# Patient Record
Sex: Female | Born: 1945 | Race: White | Hispanic: No | Marital: Married | Smoking: Never smoker
Health system: Southern US, Community
[De-identification: ages and names within clinical notes are randomized; demographics above are authoritative.]

## PROBLEM LIST (undated history)

## (undated) DIAGNOSIS — M810 Age-related osteoporosis without current pathological fracture: Secondary | ICD-10-CM

## (undated) DIAGNOSIS — Z780 Asymptomatic menopausal state: Secondary | ICD-10-CM

## (undated) DIAGNOSIS — T7840XA Allergy, unspecified, initial encounter: Secondary | ICD-10-CM

## (undated) DIAGNOSIS — Z9109 Other allergy status, other than to drugs and biological substances: Secondary | ICD-10-CM

## (undated) DIAGNOSIS — I10 Essential (primary) hypertension: Secondary | ICD-10-CM

## (undated) DIAGNOSIS — M1990 Unspecified osteoarthritis, unspecified site: Secondary | ICD-10-CM

## (undated) DIAGNOSIS — D649 Anemia, unspecified: Secondary | ICD-10-CM

## (undated) DIAGNOSIS — H269 Unspecified cataract: Secondary | ICD-10-CM

## (undated) HISTORY — DX: Asymptomatic menopausal state: Z78.0

## (undated) HISTORY — DX: Essential (primary) hypertension: I10

## (undated) HISTORY — DX: Age-related osteoporosis without current pathological fracture: M81.0

## (undated) HISTORY — PX: OTHER SURGICAL HISTORY: SHX169

## (undated) HISTORY — DX: Unspecified cataract: H26.9

## (undated) HISTORY — DX: Allergy, unspecified, initial encounter: T78.40XA

## (undated) HISTORY — PX: TUBAL LIGATION: SHX77

## (undated) HISTORY — DX: Other allergy status, other than to drugs and biological substances: Z91.09

## (undated) HISTORY — PX: CATARACT EXTRACTION, BILATERAL: SHX1313

## (undated) HISTORY — DX: Unspecified osteoarthritis, unspecified site: M19.90

## (undated) HISTORY — PX: TONSILLECTOMY: SUR1361

## (undated) HISTORY — PX: KNEE ARTHROSCOPY: SUR90

## (undated) HISTORY — DX: Anemia, unspecified: D64.9

## (undated) HISTORY — PX: COLONOSCOPY: SHX174

---

## 2004-01-13 ENCOUNTER — Ambulatory Visit (HOSPITAL_BASED_OUTPATIENT_CLINIC_OR_DEPARTMENT_OTHER): Admission: RE | Admit: 2004-01-13 | Discharge: 2004-01-13 | Payer: Self-pay | Admitting: Obstetrics and Gynecology

## 2004-01-13 ENCOUNTER — Ambulatory Visit (HOSPITAL_COMMUNITY): Admission: RE | Admit: 2004-01-13 | Discharge: 2004-01-13 | Payer: Self-pay | Admitting: Obstetrics and Gynecology

## 2011-06-08 ENCOUNTER — Other Ambulatory Visit (HOSPITAL_COMMUNITY)
Admission: RE | Admit: 2011-06-08 | Discharge: 2011-06-08 | Disposition: A | Discharge status: Home / Self Care | Payer: Medicare Other | Source: Ambulatory Visit | Attending: Internal Medicine | Admitting: Internal Medicine

## 2011-06-08 DIAGNOSIS — Z124 Encounter for screening for malignant neoplasm of cervix: Secondary | ICD-10-CM | POA: Insufficient documentation

## 2012-04-06 ENCOUNTER — Encounter: Payer: Self-pay | Admitting: Internal Medicine

## 2012-05-26 ENCOUNTER — Ambulatory Visit (AMBULATORY_SURGERY_CENTER): Payer: Medicare Other

## 2012-05-26 VITALS — Ht 64.0 in | Wt 112.8 lb

## 2012-05-26 DIAGNOSIS — Z1211 Encounter for screening for malignant neoplasm of colon: Secondary | ICD-10-CM

## 2012-05-26 MED ORDER — MOVIPREP 100 G PO SOLR: 1.0000 | Freq: Once | ORAL | Status: DC

## 2012-05-29 ENCOUNTER — Encounter: Payer: Self-pay | Admitting: Gastroenterology

## 2012-06-06 ENCOUNTER — Encounter: Payer: Self-pay | Admitting: Gastroenterology

## 2012-06-06 ENCOUNTER — Ambulatory Visit (AMBULATORY_SURGERY_CENTER): Payer: Medicare Other | Admitting: Gastroenterology

## 2012-06-06 VITALS — BP 143/70 | HR 73 | Temp 98.2°F | Resp 19 | Ht 64.0 in | Wt 112.8 lb

## 2012-06-06 DIAGNOSIS — Z800 Family history of malignant neoplasm of digestive organs: Secondary | ICD-10-CM

## 2012-06-06 DIAGNOSIS — K5730 Diverticulosis of large intestine without perforation or abscess without bleeding: Secondary | ICD-10-CM

## 2012-06-06 DIAGNOSIS — Z1211 Encounter for screening for malignant neoplasm of colon: Secondary | ICD-10-CM

## 2012-06-06 MED ORDER — SODIUM CHLORIDE 0.9 % IV SOLN: 500.0000 mL | INTRAVENOUS | Status: DC

## 2012-06-06 NOTE — Op Note (Signed)
[facility] [address] [place], [address]   COLONOSCOPY PROCEDURE REPORT  PATIENT: Melissa Mayo, Melissa Mayo  MR#: [id number] BIRTHDATE: 1946-06-12 , 66  yrs. old GENDER: Female ENDOSCOPIST: Rachael Fee, MD REFERRED NF:[id number] Terri Piedra, M.D. PROCEDURE DATE:  06/06/2012 PROCEDURE:   Colonoscopy, diagnostic ASA CLASS:   Class III INDICATIONS:elevated risk screening. MEDICATIONS: Fentanyl 100 mcg IV, Versed 8 mg IV, and These medications were titrated to patient response per physician's verbal order  DESCRIPTION OF PROCEDURE:   After the risks benefits and alternatives of the procedure were thoroughly explained, informed consent was obtained.  A digital rectal exam revealed no abnormalities of the rectum.   The LB [id number]-[id number]  endoscope was introduced through the anus and advanced to the cecum, which was identified by both the appendix and ileocecal valve. No adverse events experienced.   The quality of the prep was good.  The instrument was then slowly withdrawn as the colon was fully examined.    COLON FINDINGS: Moderate diverticulosis was noted in the left colon. The colon mucosa was otherwise normal.  Retroflexed views revealed no abnormalities. The time to cecum=8 minutes 53 seconds. Withdrawal time=7 minutes 00 seconds.  The scope was withdrawn and the procedure completed. COMPLICATIONS: There were no complications.  ENDOSCOPIC IMPRESSION: Moderate diverticulosis was noted in the left colon Otherwise normal examination: no polyps or cancers  RECOMMENDATIONS: Given your significant family history of colon cancer, you should have a repeat colonoscopy in 5 years   eSigned:  Rachael Fee, MD 06/06/2012 9:56 AM

## 2012-06-06 NOTE — Progress Notes (Signed)
Patient did not experience any of the following events: a burn prior to discharge; a fall within the facility; wrong site/side/patient/procedure/implant event; or a hospital transfer or hospital admission upon discharge from the facility. (G8907) Patient did not have preoperative order for IV antibiotic SSI prophylaxis. (G8918)  

## 2012-06-06 NOTE — Patient Instructions (Signed)

## 2012-06-07 ENCOUNTER — Encounter: Payer: Medicare Other | Admitting: Internal Medicine

## 2012-06-07 ENCOUNTER — Telehealth: Payer: Self-pay | Admitting: Gastroenterology

## 2012-06-07 ENCOUNTER — Telehealth: Payer: Self-pay | Admitting: *Deleted

## 2012-06-07 NOTE — Telephone Encounter (Signed)
Pt states she still is a little nauseated and fatigued today.  Has been able to keep food down.  No fever or pain.  Told to eat as she feels like she can and to rest today.  Told to call office tomorrow am if she isn't feeling better.  Understanding voiced

## 2012-06-07 NOTE — Telephone Encounter (Signed)
No answer at # given.  Message left on voicemail. 

## 2016-11-05 DIAGNOSIS — I10 Essential (primary) hypertension: Secondary | ICD-10-CM | POA: Diagnosis not present

## 2017-02-04 DIAGNOSIS — L821 Other seborrheic keratosis: Secondary | ICD-10-CM | POA: Diagnosis not present

## 2017-02-04 DIAGNOSIS — D2239 Melanocytic nevi of other parts of face: Secondary | ICD-10-CM | POA: Diagnosis not present

## 2017-02-04 DIAGNOSIS — D2261 Melanocytic nevi of right upper limb, including shoulder: Secondary | ICD-10-CM | POA: Diagnosis not present

## 2017-02-04 DIAGNOSIS — L570 Actinic keratosis: Secondary | ICD-10-CM | POA: Diagnosis not present

## 2017-02-04 DIAGNOSIS — L814 Other melanin hyperpigmentation: Secondary | ICD-10-CM | POA: Diagnosis not present

## 2017-02-04 DIAGNOSIS — L718 Other rosacea: Secondary | ICD-10-CM | POA: Diagnosis not present

## 2017-03-21 DIAGNOSIS — H5213 Myopia, bilateral: Secondary | ICD-10-CM | POA: Diagnosis not present

## 2017-03-21 DIAGNOSIS — H52223 Regular astigmatism, bilateral: Secondary | ICD-10-CM | POA: Diagnosis not present

## 2017-03-21 DIAGNOSIS — H524 Presbyopia: Secondary | ICD-10-CM | POA: Diagnosis not present

## 2017-03-25 DIAGNOSIS — Z0100 Encounter for examination of eyes and vision without abnormal findings: Secondary | ICD-10-CM | POA: Diagnosis not present

## 2017-04-20 ENCOUNTER — Encounter: Payer: Self-pay | Admitting: Gastroenterology

## 2017-05-06 DIAGNOSIS — E559 Vitamin D deficiency, unspecified: Secondary | ICD-10-CM | POA: Diagnosis not present

## 2017-05-06 DIAGNOSIS — I10 Essential (primary) hypertension: Secondary | ICD-10-CM | POA: Diagnosis not present

## 2017-05-06 DIAGNOSIS — Z0000 Encounter for general adult medical examination without abnormal findings: Secondary | ICD-10-CM | POA: Diagnosis not present

## 2017-05-11 DIAGNOSIS — Z01419 Encounter for gynecological examination (general) (routine) without abnormal findings: Secondary | ICD-10-CM | POA: Diagnosis not present

## 2017-05-11 DIAGNOSIS — Z0000 Encounter for general adult medical examination without abnormal findings: Secondary | ICD-10-CM | POA: Diagnosis not present

## 2017-05-11 DIAGNOSIS — E871 Hypo-osmolality and hyponatremia: Secondary | ICD-10-CM | POA: Diagnosis not present

## 2017-05-11 DIAGNOSIS — I10 Essential (primary) hypertension: Secondary | ICD-10-CM | POA: Diagnosis not present

## 2017-05-27 ENCOUNTER — Encounter: Payer: Self-pay | Admitting: *Deleted

## 2017-05-27 DIAGNOSIS — R69 Illness, unspecified: Secondary | ICD-10-CM | POA: Diagnosis not present

## 2017-06-10 DIAGNOSIS — Z1231 Encounter for screening mammogram for malignant neoplasm of breast: Secondary | ICD-10-CM | POA: Diagnosis not present

## 2017-06-13 ENCOUNTER — Ambulatory Visit (AMBULATORY_SURGERY_CENTER): Payer: Self-pay | Admitting: *Deleted

## 2017-06-13 VITALS — Ht 63.0 in | Wt 115.4 lb

## 2017-06-13 DIAGNOSIS — Z800 Family history of malignant neoplasm of digestive organs: Secondary | ICD-10-CM

## 2017-06-13 MED ORDER — NA SULFATE-K SULFATE-MG SULF 17.5-3.13-1.6 GM/177ML PO SOLN: 1.0000 [IU] | Freq: Once | ORAL | 0 refills | Status: AC

## 2017-06-13 NOTE — Progress Notes (Signed)
No egg or soy allergy known to patient  No issues with past sedation with any surgeries  or procedures, no intubation problems  No diet pills per patient No home 02 use per patient  No blood thinners per patient  Pt denies issues with constipation  No A fib or A flutter  EMMI video sent to pt's e mail  Pt. declined 

## 2017-06-14 ENCOUNTER — Encounter: Payer: Self-pay | Admitting: Gastroenterology

## 2017-06-27 ENCOUNTER — Ambulatory Visit (AMBULATORY_SURGERY_CENTER): Payer: Medicare HMO | Admitting: Gastroenterology

## 2017-06-27 ENCOUNTER — Encounter: Payer: Self-pay | Admitting: Gastroenterology

## 2017-06-27 VITALS — BP 119/69 | HR 67 | Temp 97.5°F | Resp 21 | Ht 63.0 in | Wt 115.0 lb

## 2017-06-27 DIAGNOSIS — Z800 Family history of malignant neoplasm of digestive organs: Secondary | ICD-10-CM | POA: Diagnosis present

## 2017-06-27 DIAGNOSIS — Z1211 Encounter for screening for malignant neoplasm of colon: Secondary | ICD-10-CM

## 2017-06-27 DIAGNOSIS — K5730 Diverticulosis of large intestine without perforation or abscess without bleeding: Secondary | ICD-10-CM

## 2017-06-27 DIAGNOSIS — I10 Essential (primary) hypertension: Secondary | ICD-10-CM | POA: Diagnosis not present

## 2017-06-27 MED ORDER — SODIUM CHLORIDE 0.9 % IV SOLN: 500.0000 mL | INTRAVENOUS | Status: DC

## 2017-06-27 NOTE — Patient Instructions (Signed)
**   Handouts given on diverticulosis **  YOU HAD AN ENDOSCOPIC PROCEDURE TODAY AT [facility]:   Refer to the procedure report that was given to you for any specific questions about what was found during the examination.  If the procedure report does not answer your questions, please call your gastroenterologist to clarify.  If you requested that your care partner not be given the details of your procedure findings, then the procedure report has been included in a sealed envelope for you to review at your convenience later.  YOU SHOULD EXPECT: Some feelings of bloating in the abdomen. Passage of more gas than usual.  Walking can help get rid of the air that was put into your GI tract during the procedure and reduce the bloating. If you had a lower endoscopy (such as a colonoscopy or flexible sigmoidoscopy) you may notice spotting of blood in your stool or on the toilet paper. If you underwent a bowel prep for your procedure, you may not have a normal bowel movement for a few days.  Please Note:  You might notice some irritation and congestion in your nose or some drainage.  This is from the oxygen used during your procedure.  There is no need for concern and it should clear up in a day or so.  SYMPTOMS TO REPORT IMMEDIATELY:   Following lower endoscopy (colonoscopy or flexible sigmoidoscopy):  Excessive amounts of blood in the stool  Significant tenderness or worsening of abdominal pains  Swelling of the abdomen that is new, acute  Fever of 100F or higher   For urgent or emergent issues, a gastroenterologist can be reached at any hour by calling [phone].   DIET:  We do recommend a small meal at first, but then you may proceed to your regular diet.  Drink plenty of fluids but you should avoid alcoholic beverages for 24 hours.  ACTIVITY:  You should plan to take it easy for the rest of today and you should NOT DRIVE or use heavy machinery until tomorrow (because of the  sedation medicines used during the test).    FOLLOW UP: Our staff will call the number listed on your records the next business day following your procedure to check on you and address any questions or concerns that you may have regarding the information given to you following your procedure. If we do not reach you, we will leave a message.  However, if you are feeling well and you are not experiencing any problems, there is no need to return our call.  We will assume that you have returned to your regular daily activities without incident.  If any biopsies were taken you will be contacted by phone or by letter within the next 1-3 weeks.  Please call us at [phone] if you have not heard about the biopsies in 3 weeks.    SIGNATURES/CONFIDENTIALITY: You and/or your care partner have signed paperwork which will be entered into your electronic medical record.  These signatures attest to the fact that that the information above on your After Visit Summary has been reviewed and is understood.  Full responsibility of the confidentiality of this discharge information lies with you and/or your care-partner.

## 2017-06-27 NOTE — Progress Notes (Signed)
Report to PACU, RN, vss, BBS= Clear.  

## 2017-06-27 NOTE — Op Note (Signed)
[facility] Patient Name: Melissa Mayo Procedure Date: 06/27/2017 8:39 AM MRN: [id number] Endoscopist: Milus Banister , MD Age: 71 Referring MD:  Date of Birth: 05/12/1946 Gender: Female Account #: [id number] Procedure:                Colonoscopy Indications:              Screening patient at increased risk: Family history                            of 1st-degree relative with colorectal cancer at                            age 42 years (or older); father was diagnosed with                            CRC at age 28 Medicines:                Monitored Anesthesia Care Procedure:                Pre-Anesthesia Assessment:                           - Prior to the procedure, a History and Physical                            was performed, and patient medications and                            allergies were reviewed. The patient's tolerance of                            previous anesthesia was also reviewed. The risks                            and benefits of the procedure and the sedation                            options and risks were discussed with the patient.                            All questions were answered, and informed consent                            was obtained. Prior Anticoagulants: The patient has                            taken no previous anticoagulant or antiplatelet                            agents. ASA Grade Assessment: II - A patient with                            mild systemic disease. After reviewing the risks  and benefits, the patient was deemed in                            satisfactory condition to undergo the procedure.                           After obtaining informed consent, the colonoscope                            was passed under direct vision. Throughout the                            procedure, the patient's blood pressure, pulse, and                            oxygen saturations were monitored  continuously. The                            Model CF-[id number]) scope was introduced                            through the anus and advanced to the the cecum,                            identified by appendiceal orifice and ileocecal                            valve. The colonoscopy was performed without                            difficulty. The patient tolerated the procedure                            well. The quality of the bowel preparation was                            good. The ileocecal valve, appendiceal orifice, and                            rectum were photographed. Scope In: 8:46:16 AM Scope Out: 9:00:39 AM Scope Withdrawal Time: 0 hours 6 minutes 29 seconds  Total Procedure Duration: 0 hours 14 minutes 23 seconds  Findings:                 Multiple small and large-mouthed diverticula were                            found in the left colon.                           The exam was otherwise without abnormality on                            direct and retroflexion views. Complications:  No immediate complications. Estimated blood loss:                            None. Estimated Blood Loss:     Estimated blood loss: none. Impression:               - Diverticulosis in the left colon.                           - The examination was otherwise normal on direct                            and retroflexion views.                           - No polyps or cancers Recommendation:           - Patient has a contact number available for                            emergencies. The signs and symptoms of potential                            delayed complications were discussed with the                            patient. Return to normal activities tomorrow.                            Written discharge instructions were provided to the                            patient.                           - Resume previous diet.                           - Continue present  medications.                           You do not need any further colon cancer screening                            tests (including stool testing). These types of                            tests generally stop around age 40-80. Milus Banister, MD 06/27/2017 9:03:18 AM This report has been signed electronically.

## 2017-06-27 NOTE — Progress Notes (Signed)
Pt's states no medical or surgical changes since previsit or office visit. 

## 2017-06-28 ENCOUNTER — Telehealth: Payer: Self-pay | Admitting: *Deleted

## 2017-06-28 NOTE — Telephone Encounter (Signed)
  Follow up Call-  Call back number 06/27/2017  Post procedure Call Back phone  # [phone]  Permission to leave phone message Yes  Some recent data might be hidden     Patient questions:  Do you have a fever, pain , or abdominal swelling? No. Pain Score  0 *  Have you tolerated food without any problems? Yes.    Have you been able to return to your normal activities? No.  Do you have any questions about your discharge instructions: Diet   No. Medications  No. Follow up visit  No.  Do you have questions or concerns about your Care? Yes.  Patient reports still feeling a little "nauseated after the procedure/" she was able to eat and is keeping that down. Slept well last night. Just encouraged her to take it easy today and continue to push fluids. She will call us back if she continues to have nausea. SM  Actions: * If pain score is 4 or above: No action needed, pain <4.

## 2017-06-29 ENCOUNTER — Ambulatory Visit (INDEPENDENT_AMBULATORY_CARE_PROVIDER_SITE_OTHER)
Admission: RE | Admit: 2017-06-29 | Discharge: 2017-06-29 | Disposition: A | Discharge status: Home / Self Care | Payer: Medicare HMO | Source: Ambulatory Visit | Attending: Gastroenterology | Admitting: Gastroenterology

## 2017-06-29 ENCOUNTER — Other Ambulatory Visit (INDEPENDENT_AMBULATORY_CARE_PROVIDER_SITE_OTHER): Payer: Medicare HMO

## 2017-06-29 ENCOUNTER — Observation Stay (HOSPITAL_BASED_OUTPATIENT_CLINIC_OR_DEPARTMENT_OTHER)
Admission: EM | Admit: 2017-06-29 | Discharge: 2017-07-01 | Disposition: A | Discharge status: Home / Self Care | Payer: Medicare HMO | Attending: Internal Medicine | Admitting: Internal Medicine

## 2017-06-29 ENCOUNTER — Telehealth: Payer: Self-pay | Admitting: Gastroenterology

## 2017-06-29 ENCOUNTER — Encounter (HOSPITAL_BASED_OUTPATIENT_CLINIC_OR_DEPARTMENT_OTHER): Payer: Self-pay

## 2017-06-29 ENCOUNTER — Telehealth: Payer: Self-pay

## 2017-06-29 ENCOUNTER — Emergency Department (HOSPITAL_BASED_OUTPATIENT_CLINIC_OR_DEPARTMENT_OTHER): Payer: Medicare HMO

## 2017-06-29 DIAGNOSIS — Z79899 Other long term (current) drug therapy: Secondary | ICD-10-CM | POA: Diagnosis not present

## 2017-06-29 DIAGNOSIS — Z800 Family history of malignant neoplasm of digestive organs: Secondary | ICD-10-CM | POA: Diagnosis not present

## 2017-06-29 DIAGNOSIS — E861 Hypovolemia: Secondary | ICD-10-CM | POA: Diagnosis not present

## 2017-06-29 DIAGNOSIS — R112 Nausea with vomiting, unspecified: Secondary | ICD-10-CM

## 2017-06-29 DIAGNOSIS — M1990 Unspecified osteoarthritis, unspecified site: Secondary | ICD-10-CM | POA: Diagnosis not present

## 2017-06-29 DIAGNOSIS — I739 Peripheral vascular disease, unspecified: Secondary | ICD-10-CM | POA: Diagnosis not present

## 2017-06-29 DIAGNOSIS — R4182 Altered mental status, unspecified: Secondary | ICD-10-CM | POA: Diagnosis not present

## 2017-06-29 DIAGNOSIS — I10 Essential (primary) hypertension: Secondary | ICD-10-CM | POA: Diagnosis not present

## 2017-06-29 DIAGNOSIS — R51 Headache: Secondary | ICD-10-CM | POA: Diagnosis not present

## 2017-06-29 DIAGNOSIS — Z9841 Cataract extraction status, right eye: Secondary | ICD-10-CM | POA: Insufficient documentation

## 2017-06-29 DIAGNOSIS — M810 Age-related osteoporosis without current pathological fracture: Secondary | ICD-10-CM | POA: Diagnosis not present

## 2017-06-29 DIAGNOSIS — D649 Anemia, unspecified: Secondary | ICD-10-CM | POA: Diagnosis not present

## 2017-06-29 DIAGNOSIS — R9431 Abnormal electrocardiogram [ECG] [EKG]: Secondary | ICD-10-CM | POA: Diagnosis present

## 2017-06-29 DIAGNOSIS — M419 Scoliosis, unspecified: Secondary | ICD-10-CM | POA: Diagnosis not present

## 2017-06-29 DIAGNOSIS — Z9842 Cataract extraction status, left eye: Secondary | ICD-10-CM | POA: Insufficient documentation

## 2017-06-29 DIAGNOSIS — Z9889 Other specified postprocedural states: Secondary | ICD-10-CM

## 2017-06-29 DIAGNOSIS — I4581 Long QT syndrome: Secondary | ICD-10-CM | POA: Diagnosis not present

## 2017-06-29 DIAGNOSIS — E876 Hypokalemia: Secondary | ICD-10-CM | POA: Diagnosis not present

## 2017-06-29 DIAGNOSIS — E871 Hypo-osmolality and hyponatremia: Secondary | ICD-10-CM | POA: Diagnosis not present

## 2017-06-29 DIAGNOSIS — E860 Dehydration: Secondary | ICD-10-CM | POA: Diagnosis not present

## 2017-06-29 DIAGNOSIS — R110 Nausea: Secondary | ICD-10-CM | POA: Diagnosis not present

## 2017-06-29 LAB — CBC WITH DIFFERENTIAL/PLATELET
BASOS ABS: 0 10*3/uL (ref 0.0–0.1)
Basophils Relative: 0.1 % (ref 0.0–3.0)
EOS ABS: 0 10*3/uL (ref 0.0–0.7)
Eosinophils Relative: 0.1 % (ref 0.0–5.0)
HCT: 37.8 % (ref 36.0–46.0)
Hemoglobin: 13.2 g/dL (ref 12.0–15.0)
LYMPHS ABS: 0.6 10*3/uL — AB (ref 0.7–4.0)
Lymphocytes Relative: 7.1 % — ABNORMAL LOW (ref 12.0–46.0)
MCHC: 35 g/dL (ref 30.0–36.0)
MCV: 87 fl (ref 78.0–100.0)
MONO ABS: 0.2 10*3/uL (ref 0.1–1.0)
Monocytes Relative: 3 % (ref 3.0–12.0)
NEUTROS ABS: 7.3 10*3/uL (ref 1.4–7.7)
NEUTROS PCT: 89.7 % — AB (ref 43.0–77.0)
Platelets: 309 10*3/uL (ref 150.0–400.0)
RBC: 4.34 Mil/uL (ref 3.87–5.11)
RDW: 13.5 % (ref 11.5–15.5)
WBC: 8.1 10*3/uL (ref 4.0–10.5)

## 2017-06-29 LAB — COMPREHENSIVE METABOLIC PANEL
ALBUMIN: 4.1 g/dL (ref 3.5–5.0)
ALT: 11 U/L (ref 0–35)
ALT: 15 U/L (ref 14–54)
AST: 17 U/L (ref 0–37)
AST: 29 U/L (ref 15–41)
Albumin: 4.3 g/dL (ref 3.5–5.2)
Alkaline Phosphatase: 74 U/L (ref 39–117)
Alkaline Phosphatase: 74 U/L (ref 38–126)
Anion gap: 12 (ref 5–15)
BILIRUBIN TOTAL: 1.1 mg/dL (ref 0.2–1.2)
BILIRUBIN TOTAL: 1.3 mg/dL — AB (ref 0.3–1.2)
BUN: 7 mg/dL (ref 6–23)
BUN: 8 mg/dL (ref 6–20)
CHLORIDE: 79 mmol/L — AB (ref 101–111)
CO2: 27 meq/L (ref 19–32)
CO2: 24 mmol/L (ref 22–32)
CREATININE: 0.49 mg/dL (ref 0.40–1.20)
Calcium: 9.4 mg/dL (ref 8.4–10.5)
Calcium: 9 mg/dL (ref 8.9–10.3)
Chloride: 80 mEq/L — ABNORMAL LOW (ref 96–112)
Creatinine, Ser: 0.41 mg/dL — ABNORMAL LOW (ref 0.44–1.00)
GFR calc Af Amer: >60 mL/min (ref >60)
GFR calc non Af Amer: >60 mL/min (ref >60)
GFR: 131.75 mL/min (ref >60.00)
GLUCOSE: 125 mg/dL — AB (ref 70–99)
GLUCOSE: 126 mg/dL — AB (ref 65–99)
POTASSIUM: 2.6 mmol/L — AB (ref 3.5–5.1)
Potassium: 2.9 mEq/L — ABNORMAL LOW (ref 3.5–5.1)
SODIUM: 117 meq/L — AB (ref 135–145)
Sodium: 115 mmol/L — CL (ref 135–145)
TOTAL PROTEIN: 7.3 g/dL (ref 6.0–8.3)
Total Protein: 7.4 g/dL (ref 6.5–8.1)

## 2017-06-29 LAB — CBC
HEMATOCRIT: 34.1 % — AB (ref 36.0–46.0)
Hemoglobin: 12.4 g/dL (ref 12.0–15.0)
MCH: 29.9 pg (ref 26.0–34.0)
MCHC: 36.4 g/dL — AB (ref 30.0–36.0)
MCV: 82.2 fL (ref 78.0–100.0)
Platelets: 295 10*3/uL (ref 150–400)
RBC: 4.15 MIL/uL (ref 3.87–5.11)
RDW: 12.2 % (ref 11.5–15.5)
WBC: 7 10*3/uL (ref 4.0–10.5)

## 2017-06-29 LAB — URINALYSIS, ROUTINE W REFLEX MICROSCOPIC
BILIRUBIN URINE: NEGATIVE
GLUCOSE, UA: NEGATIVE mg/dL
KETONES UR: 15 mg/dL — AB
Leukocytes, UA: NEGATIVE
Nitrite: NEGATIVE
PH: 7.5 (ref 5.0–8.0)
Protein, ur: NEGATIVE mg/dL
Specific Gravity, Urine: 1.01 (ref 1.005–1.030)

## 2017-06-29 LAB — BASIC METABOLIC PANEL
Anion gap: 11 (ref 5–15)
BUN: 6 mg/dL (ref 6–20)
CHLORIDE: 82 mmol/L — AB (ref 101–111)
CO2: 25 mmol/L (ref 22–32)
Calcium: 8.5 mg/dL — ABNORMAL LOW (ref 8.9–10.3)
Creatinine, Ser: 0.53 mg/dL (ref 0.44–1.00)
Glucose, Bld: 108 mg/dL — ABNORMAL HIGH (ref 65–99)
POTASSIUM: 2.8 mmol/L — AB (ref 3.5–5.1)
SODIUM: 118 mmol/L — AB (ref 135–145)

## 2017-06-29 LAB — URINALYSIS, MICROSCOPIC (REFLEX)

## 2017-06-29 MED ORDER — POTASSIUM CHLORIDE 10 MEQ/100ML IV SOLN
10.0000 meq | INTRAVENOUS | Status: AC
  Administered 2017-06-29 – 2017-06-30 (×3): 10 meq via INTRAVENOUS
  Filled 2017-06-29 (×3): qty 100

## 2017-06-29 MED ORDER — POTASSIUM CHLORIDE CRYS ER 20 MEQ PO TBCR
20.0000 meq | EXTENDED_RELEASE_TABLET | Freq: Once | ORAL | Status: AC
  Administered 2017-06-29: 20 meq via ORAL
  Filled 2017-06-29: qty 1

## 2017-06-29 MED ORDER — POTASSIUM CHLORIDE CRYS ER 20 MEQ PO TBCR
EXTENDED_RELEASE_TABLET | ORAL | Status: AC
  Filled 2017-06-29: qty 1

## 2017-06-29 MED ORDER — SODIUM CHLORIDE 0.9 % IV SOLN: 250.0000 mL | INTRAVENOUS | Status: DC | PRN

## 2017-06-29 MED ORDER — SODIUM CHLORIDE 0.9% FLUSH
3.0000 mL | Freq: Two times a day (BID) | INTRAVENOUS | Status: DC
  Administered 2017-06-29 – 2017-07-01 (×4): 3 mL via INTRAVENOUS

## 2017-06-29 MED ORDER — CALCIUM CITRATE-VITAMIN D 315-200 MG-UNIT PO TABS: 2.0000 | ORAL_TABLET | Freq: Every day | ORAL | Status: DC

## 2017-06-29 MED ORDER — SODIUM CHLORIDE 0.9 % IV BOLUS (SEPSIS)
1000.0000 mL | Freq: Once | INTRAVENOUS | Status: AC
  Administered 2017-06-29: 1000 mL via INTRAVENOUS

## 2017-06-29 MED ORDER — LORAZEPAM 2 MG/ML IJ SOLN: 0.5000 mg | INTRAMUSCULAR | Status: DC | PRN

## 2017-06-29 MED ORDER — CALCIUM CARBONATE-VITAMIN D 500-200 MG-UNIT PO TABS
2.0000 | ORAL_TABLET | Freq: Every day | ORAL | Status: DC
  Administered 2017-06-30 – 2017-07-01 (×2): 2 via ORAL
  Filled 2017-06-29 (×2): qty 2

## 2017-06-29 MED ORDER — VITAMIN B-12 1000 MCG PO TABS
1000.0000 ug | ORAL_TABLET | Freq: Every day | ORAL | Status: DC
  Administered 2017-06-30 – 2017-07-01 (×2): 1000 ug via ORAL
  Filled 2017-06-29 (×2): qty 1

## 2017-06-29 MED ORDER — POTASSIUM CHLORIDE 10 MEQ/100ML IV SOLN
10.0000 meq | Freq: Once | INTRAVENOUS | Status: AC
  Administered 2017-06-29: 10 meq via INTRAVENOUS
  Filled 2017-06-29: qty 100

## 2017-06-29 MED ORDER — ACETAMINOPHEN 650 MG RE SUPP: 650.0000 mg | Freq: Four times a day (QID) | RECTAL | Status: DC | PRN

## 2017-06-29 MED ORDER — BISACODYL 5 MG PO TBEC: 5.0000 mg | DELAYED_RELEASE_TABLET | Freq: Every day | ORAL | Status: DC | PRN

## 2017-06-29 MED ORDER — POTASSIUM CHLORIDE IN NACL 40-0.9 MEQ/L-% IV SOLN
INTRAVENOUS | Status: AC
  Administered 2017-06-29: 60 mL/h via INTRAVENOUS
  Filled 2017-06-29: qty 1000

## 2017-06-29 MED ORDER — ACETAMINOPHEN 325 MG PO TABS: 650.0000 mg | ORAL_TABLET | Freq: Four times a day (QID) | ORAL | Status: DC | PRN

## 2017-06-29 MED ORDER — LORATADINE 10 MG PO TABS
10.0000 mg | ORAL_TABLET | Freq: Every day | ORAL | Status: DC
  Administered 2017-06-30 – 2017-07-01 (×2): 10 mg via ORAL
  Filled 2017-06-29 (×2): qty 1

## 2017-06-29 MED ORDER — ONDANSETRON HCL 4 MG/2ML IJ SOLN
4.0000 mg | Freq: Once | INTRAMUSCULAR | Status: AC | PRN
  Administered 2017-06-29: 4 mg via INTRAVENOUS
  Filled 2017-06-29: qty 2

## 2017-06-29 MED ORDER — SODIUM CHLORIDE 0.9% FLUSH
3.0000 mL | Freq: Two times a day (BID) | INTRAVENOUS | Status: DC
  Administered 2017-06-30: 3 mL via INTRAVENOUS

## 2017-06-29 MED ORDER — POTASSIUM CHLORIDE 10 MEQ/100ML IV SOLN: 10.0000 meq | INTRAVENOUS | Status: DC

## 2017-06-29 MED ORDER — ENOXAPARIN SODIUM 30 MG/0.3ML ~~LOC~~ SOLN
30.0000 mg | SUBCUTANEOUS | Status: DC
  Administered 2017-06-29: 30 mg via SUBCUTANEOUS
  Filled 2017-06-29: qty 0.3

## 2017-06-29 MED ORDER — HYDRALAZINE HCL 20 MG/ML IJ SOLN: 10.0000 mg | INTRAMUSCULAR | Status: DC | PRN

## 2017-06-29 MED ORDER — POTASSIUM CHLORIDE CRYS ER 20 MEQ PO TBCR
40.0000 meq | EXTENDED_RELEASE_TABLET | Freq: Once | ORAL | Status: AC
  Administered 2017-06-29: 40 meq via ORAL
  Filled 2017-06-29: qty 2

## 2017-06-29 MED ORDER — MAGNESIUM SULFATE IN D5W 1-5 GM/100ML-% IV SOLN
1.0000 g | Freq: Once | INTRAVENOUS | Status: AC
  Administered 2017-06-29: 1 g via INTRAVENOUS
  Filled 2017-06-29: qty 100

## 2017-06-29 MED ORDER — ATENOLOL 50 MG PO TABS
100.0000 mg | ORAL_TABLET | Freq: Every day | ORAL | Status: DC
  Administered 2017-06-30 – 2017-07-01 (×2): 100 mg via ORAL
  Filled 2017-06-29 (×2): qty 2

## 2017-06-29 MED ORDER — SENNOSIDES-DOCUSATE SODIUM 8.6-50 MG PO TABS: 1.0000 | ORAL_TABLET | Freq: Every evening | ORAL | Status: DC | PRN

## 2017-06-29 MED ORDER — HYDROCODONE-ACETAMINOPHEN 5-325 MG PO TABS: 1.0000 | ORAL_TABLET | ORAL | Status: DC | PRN

## 2017-06-29 MED ORDER — SODIUM CHLORIDE 0.9% FLUSH: 3.0000 mL | INTRAVENOUS | Status: DC | PRN

## 2017-06-29 NOTE — Telephone Encounter (Signed)
Lets get some labs (cbc, bmet) and KUB (flat and upright, r/o free air).  Push fluids for now.

## 2017-06-29 NOTE — Telephone Encounter (Signed)
Pt is having nausea since 10/9 colon on 06/27/17.  Normal BM's.  Weak and dizzy.  Vomited last night and felt better for a little while.  She has not taken anything for the nausea.  Not eating and drinking.  Please advise.

## 2017-06-29 NOTE — H&P (Signed)
History and Physical    Melissa Mayo ATF:[id number] DOB: 11/04/1945 DOA: 06/29/2017  PCP: Deland Pretty, MD   Patient coming from: Home, by way of [facility]   Chief Complaint: nausea, vomiting, malaise   HPI: Melissa Mayo is a 71 y.o. female with medical history significant for hypertension, now presenting to the emergency department for evaluation of nausea, vomiting, and malaise. Patient had been in her usual state of fairly good health and underwent a routine screening colonoscopy on 06/27/2017, tolerated the procedure well. The following day, she developed mild abdominal discomfort with nausea and nonbloody vomiting. Symptoms continued to persist and she contacted the GI clinic today for advice. She was brought in for a KUB and basic blood work, was noted to have a critical hyponatremia to 117, and was directed to the ED for further evaluation of this. Reports a mild headache, but no change in vision or hearing, no focal numbness or weakness, and no confusion.   [facility] ED Course: Upon arrival to the ED, patient is found to be afebrile, saturating well on room air, and with vitals otherwise stable. EKG features a sinus rhythm with QTc interval prolonged 559 ms. KUB is a normal exam and noncontrast head CT is negative for acute intercranial abnormality. Chemistry panels notable for sodium of 115, potassium 2.6, and chloride of 79. CBC is unremarkable and urinalysis is also unremarkable. Patient was treated with Zofran, 1 L of normal saline, 10 mEq IV potassium, and 40 mg of Lipitor potassium. She remained hemodynamically stable in the ED and in no apparent distress, and she will be admitted to the telemetry unit for ongoing evaluation and management of hyponatremia.   Review of Systems:  All other systems reviewed and apart from HPI, are negative.  Past Medical History:  Diagnosis Date  . Allergy   . Anemia   . Arthritis   . Cataract    both eyes surgically removed  .  Environmental allergies   . Hypertension   . Osteoporosis   . Postmenopausal     Past Surgical History:  Procedure Laterality Date  . CATARACT EXTRACTION, BILATERAL     2003  . COLONOSCOPY    . cyst underneath left breast     1999 benign  . gynecology procedure     2004  . KNEE ARTHROSCOPY     1961  . ovarian cystectomy, left     1970  . TONSILLECTOMY     age 61  . TUBAL LIGATION     1980     reports that she has never smoked. She has never used smokeless tobacco. She reports that she does not drink alcohol or use drugs.  No Known Allergies  Family History  Problem Relation Age of Onset  . Colon cancer Father   . Colon polyps Neg Hx   . Esophageal cancer Neg Hx   . Rectal cancer Neg Hx   . Stomach cancer Neg Hx      Prior to Admission medications   Medication Sig Start Date End Date Taking? Authorizing Provider  atenolol-chlorthalidone (TENORETIC) 100-25 MG per tablet Take 0.5 tablets by mouth daily.    [provider]  calcium citrate-vitamin D (CITRACAL+D) 315-200 MG-UNIT per tablet Take 2 tablets by mouth daily.    [provider]  cetirizine (ZYRTEC) 10 MG tablet Take 10 mg by mouth daily.    [provider]  cyanocobalamin 1000 MCG tablet Take 1,000 mcg by mouth daily.    [provider]  metroNIDAZOLE (METROCREAM) 0.75 % cream Apply 1 application topically 2 (two) times daily.    [provider]  potassium chloride (K-DUR) 10 MEQ tablet Take 10 mEq by mouth daily.    [provider]  Probiotic Product (PROBIOTIC DAILY PO) Take 1 capsule by mouth daily.    [provider]    Physical Exam: Vitals:   06/29/17 1715 06/29/17 1800 06/29/17 1845 06/29/17 2038  BP: (!) 162/73 (!) 169/74 (!) 165/77 (!) 161/74  Pulse:  75 70 70  Resp: 20 18 18 18   Temp:    98.1 F (36.7 C)  TempSrc:    Oral  SpO2: 99% 100% 100% 97%  Weight:      Height:          Constitutional: NAD, calm, comfortable,  frail-appearing Eyes: PERTLA, lids and conjunctivae normal ENMT: Mucous membranes are moist. Posterior pharynx clear of any exudate or lesions.   Neck: normal, supple, no masses, no thyromegaly Respiratory: clear to auscultation bilaterally, no wheezing, no crackles. Normal respiratory effort.   Cardiovascular: S1 & S2 heard, regular rate and rhythm. No extremity edema. No significant JVD. Abdomen: No distension, no tenderness, no masses palpated. Bowel sounds normal.  Musculoskeletal: no clubbing / cyanosis. No joint deformity upper and lower extremities.   Skin: no significant rashes, lesions, ulcers. Poor turgor. Neurologic: CN 2-12 grossly intact. Sensation intact. Strength 5/5 in all 4 limbs.  Psychiatric: Alert and oriented x 3. Pleasant, cooperative.     Labs on Admission: I have personally reviewed following labs and imaging studies  CBC:  Recent Labs Lab 06/29/17 1019 06/29/17 1449  WBC 8.1 7.0  NEUTROABS 7.3  --   HGB 13.2 12.4  HCT 37.8 34.1*  MCV 87.0 82.2  PLT 309.0 287   Basic Metabolic Panel:  Recent Labs Lab 06/29/17 1019 06/29/17 1449  NA 117* 115*  K 2.9* 2.6*  CL 80* 79*  CO2 27 24  GLUCOSE 125* 126*  BUN 7 8  CREATININE 0.49 0.41*  CALCIUM 9.4 9.0   GFR: Estimated Creatinine Clearance: 52.4 mL/min (A) (by C-G formula based on SCr of 0.41 mg/dL (L)). Liver Function Tests:  Recent Labs Lab 06/29/17 1019 06/29/17 1449  AST 17 29  ALT 11 15  ALKPHOS 74 74  BILITOT 1.1 1.3*  PROT 7.3 7.4  ALBUMIN 4.3 4.1   No results for input(s): LIPASE, AMYLASE in the last 168 hours. No results for input(s): AMMONIA in the last 168 hours. Coagulation Profile: No results for input(s): INR, PROTIME in the last 168 hours. Cardiac Enzymes: No results for input(s): CKTOTAL, CKMB, CKMBINDEX, TROPONINI in the last 168 hours. BNP (last 3 results) No results for input(s): PROBNP in the last 8760 hours. HbA1C: No results for input(s): HGBA1C in the last 72  hours. CBG: No results for input(s): GLUCAP in the last 168 hours. Lipid Profile: No results for input(s): CHOL, HDL, LDLCALC, TRIG, CHOLHDL, LDLDIRECT in the last 72 hours. Thyroid Function Tests: No results for input(s): TSH, T4TOTAL, FREET4, T3FREE, THYROIDAB in the last 72 hours. Anemia Panel: No results for input(s): VITAMINB12, FOLATE, FERRITIN, TIBC, IRON, RETICCTPCT in the last 72 hours. Urine analysis:    Component Value Date/Time   COLORURINE YELLOW 06/29/2017 1615   APPEARANCEUR CLOUDY (A) 06/29/2017 1615   LABSPEC 1.010 06/29/2017 1615   PHURINE 7.5 06/29/2017 1615   GLUCOSEU NEGATIVE 06/29/2017 1615   HGBUR SMALL (A) 06/29/2017 1615   BILIRUBINUR NEGATIVE 06/29/2017 1615   KETONESUR 15 (A)  06/29/2017 [place] 06/29/2017 1615   NITRITE NEGATIVE 06/29/2017 1615   LEUKOCYTESUR NEGATIVE 06/29/2017 1615   Sepsis Labs: @LABRCNTIP (procalcitonin:4,lacticidven:4) )No results found for this or any previous visit (from the past 240 hour(s)).   Radiological Exams on Admission: Dg Abd 1 View  Result Date: 06/29/2017 CLINICAL DATA:  Nausea and weakness.  Soreness after colonoscopy. EXAM: ABDOMEN - 1 VIEW COMPARISON:  None. FINDINGS: Dextroscoliosis of the lumbar spine. Nonobstructive bowel gas pattern. Oval-shaped density in the left abdomen may represent an ingested tablet. Limited evaluation for free air on this supine image. IMPRESSION: Normal bowel gas pattern. Scoliosis. Electronically Signed   By: Markus Daft M.D.   On: 06/29/2017 16:04   Ct Head Wo Contrast  Result Date: 06/29/2017 CLINICAL DATA:  Altered mental status, nausea. EXAM: CT HEAD WITHOUT CONTRAST TECHNIQUE: Contiguous axial images were obtained from the base of the skull through the vertex without intravenous contrast. COMPARISON:  None. FINDINGS: Brain: There is atrophy and chronic small vessel disease changes. No acute intracranial abnormality. Specifically, no hemorrhage, hydrocephalus, mass  lesion, acute infarction, or significant intracranial injury. Vascular: No hyperdense vessel or unexpected calcification. Skull: No acute calvarial abnormality. Sinuses/Orbits: No acute finding. Other: None IMPRESSION: No acute intracranial abnormality. Atrophy, chronic microvascular disease. Electronically Signed   By: Rolm Baptise M.D.   On: 06/29/2017 17:55    EKG: Independently reviewed. Sinus rhythm, QTc 559 ms.   Assessment/Plan  1. Hyponatremia - Pt presents with nausea, vomiting, mild headache, and malaise after screening colonoscopy the day prior  - Found to have serum sodium of 115 - Appeared hypovolemic in setting of colonoscopy prep and N/V, and she was treated with 1 liter NS in ED - Suspect this is secondary to hypovolemia, but chlorthalidone may be contributing and will be held  - Plan to check repeat chem panel now and q6h, check urine sodium and osms, consider giving more IVF pending repeat chemistries  - Goal will be for serum sodium of ~122 tomorrow afternoon  2. Hypokalemia  - Serum potassium is 2.6 on admission, likely secondary to GI-losses  - She was treated in the ED with 10 mEq IV and 40 mEq oral potassium  - Plan to continue cardiac monitoring, repeat chemistry panel now    3. Prolonged QT interval  - QTc is prolonged to 559 ms on admission - Likely secondary to hypokalemia, treating as above  - Continue cardiac monitoring, avoid offending agents    4. Hypertension  - BP is slightly elevated on arrival  - Hold chlorthalidone in setting of electrolyte abnormalities, continue atenolol  - Use hydralazine IVP's prn    5. Nausea, vomiting  - Secondary to colonoscopy prep vs hyponatremia  - Relieved with Zofran in ED - Will use low-dose prn Ativan for now until QTc normalizes     DVT prophylaxis: Lovenox Code Status: Full  Family Communication: Discussed with patient Disposition Plan: Observe on telemetry Consults called: None Admission status: Observation     Vianne Bulls, MD [facility] Pager [phone]  If 7PM-7AM, please contact night-coverage [email or web address] Password [email or web address]  06/29/2017, 9:14 PM

## 2017-06-29 NOTE — ED Notes (Signed)
Pt on monitor 

## 2017-06-29 NOTE — Telephone Encounter (Signed)
The lab called with a critical sodium level of 117.

## 2017-06-29 NOTE — ED Notes (Signed)
Report given to [facility] 

## 2017-06-29 NOTE — ED Notes (Signed)
Patient transported to CT 

## 2017-06-29 NOTE — ED Provider Notes (Signed)
[facility] DEPT [facility] Provider Note   CSN: [id number] Arrival date & time: 06/29/17  1348     History   Chief Complaint Chief Complaint  Patient presents with  . Abnormal Lab    HPI Melissa Mayo is a 71 y.o. female.  Pt presents to the ED today with n/v and low sodium.  Pt had a colonoscopy on 10/8 by Dr. Ardis Hughs.  Since then, she has not been feeling well and has had n/v.  She called Dr. Audelia Acton office and was told to return today if she continues to feel poorly.  She did make an appt and was seen this morning.  She had labs drawn which showed a sodium of 117.  She also had a 1 view abdomen xray which has not yet been read.  The pt does have a hx of low sodium in the past, but it was corrected according to patient.      Past Medical History:  Diagnosis Date  . Allergy   . Anemia   . Arthritis   . Cataract    both eyes surgically removed  . Environmental allergies   . Hypertension   . Osteoporosis   . Postmenopausal     There are no active problems to display for this patient.   Past Surgical History:  Procedure Laterality Date  . CATARACT EXTRACTION, BILATERAL     2003  . COLONOSCOPY    . cyst underneath left breast     1999 benign  . gynecology procedure     2004  . KNEE ARTHROSCOPY     1961  . ovarian cystectomy, left     1970  . TONSILLECTOMY     age 50  . TUBAL LIGATION     1980    OB History    No data available       Home Medications    Prior to Admission medications   Medication Sig Start Date End Date Taking? Authorizing Provider  atenolol-chlorthalidone (TENORETIC) 100-25 MG per tablet Take 0.5 tablets by mouth daily.    [provider]  calcium citrate-vitamin D (CITRACAL+D) 315-200 MG-UNIT per tablet Take 2 tablets by mouth daily.    [provider]  cetirizine (ZYRTEC) 10 MG tablet Take 10 mg by mouth daily.    [provider]  cyanocobalamin 1000 MCG tablet Take 1,000 mcg by mouth daily.    [provider]  metroNIDAZOLE (METROCREAM) 0.75 % cream Apply 1 application topically 2 (two) times daily.    [provider]  potassium chloride (K-DUR) 10 MEQ tablet Take 10 mEq by mouth daily.    [provider]  Probiotic Product (PROBIOTIC DAILY PO) Take 1 capsule by mouth daily.    [provider]    Family History Family History  Problem Relation Age of Onset  . Colon cancer Father   . Colon polyps Neg Hx   . Esophageal cancer Neg Hx   . Rectal cancer Neg Hx   . Stomach cancer Neg Hx     Social History Social History  Substance Use Topics  . Smoking status: Never Smoker  . Smokeless tobacco: Never Used  . Alcohol use No     Allergies   Patient has no known allergies.   Review of Systems Review of Systems  Gastrointestinal: Positive for nausea and vomiting.  Neurological: Positive for weakness.  All other systems reviewed and are negative.    Physical Exam Updated Vital Signs BP (!) 168/81   Pulse  76   Temp 97.6 F (36.4 C) (Oral)   Resp 18   Ht 5\' 3"  (1.6 m)   Wt 52.2 kg (115 lb)   SpO2 100%   BMI 20.37 kg/m   Physical Exam  Constitutional: She is oriented to person, place, and time. She appears well-developed and well-nourished.  HENT:  Head: Normocephalic and atraumatic.  Right Ear: External ear normal.  Left Ear: External ear normal.  Nose: Nose normal.  Mouth/Throat: Mucous membranes are dry.  Eyes: Pupils are equal, round, and reactive to light. Conjunctivae and EOM are normal.  Neck: Normal range of motion. Neck supple.  Cardiovascular: Normal rate, regular rhythm, normal heart sounds and intact distal pulses.   Pulmonary/Chest: Effort normal and breath sounds normal.  Abdominal: Soft. Bowel sounds are normal.  Musculoskeletal: Normal range of motion.  Neurological: She is alert and oriented to person, place, and time.  Skin: Skin is warm. Capillary refill takes less than 2 seconds.  Psychiatric: She has a  normal mood and affect. Her behavior is normal. Judgment and thought content normal.  Nursing note and vitals reviewed.    ED Treatments / Results  Labs (all labs ordered are listed, but only abnormal results are displayed) Labs Reviewed  COMPREHENSIVE METABOLIC PANEL - Abnormal; Notable for the following:       Result Value   Sodium 115 (*)    Potassium 2.6 (*)    Chloride 79 (*)    Glucose, Bld 126 (*)    Creatinine, Ser 0.41 (*)    Total Bilirubin 1.3 (*)    All other components within normal limits  CBC - Abnormal; Notable for the following:    HCT 34.1 (*)    MCHC 36.4 (*)    All other components within normal limits  URINALYSIS, ROUTINE W REFLEX MICROSCOPIC    EKG  EKG Interpretation  Date/Time:  Wednesday June 29 2017 15:47:39 EDT Ventricular Rate:  79 PR Interval:    QRS Duration: 85 QT Interval:  487 QTC Calculation: 559 R Axis:   34 Text Interpretation:  Sinus rhythm Borderline repolarization abnormality Prolonged QT interval Confirmed by Isla Pence [phone]) on 06/29/2017 3:57:30 PM       Radiology Dg Abd 1 View  Result Date: 06/29/2017 CLINICAL DATA:  Nausea and weakness.  Soreness after colonoscopy. EXAM: ABDOMEN - 1 VIEW COMPARISON:  None. FINDINGS: Dextroscoliosis of the lumbar spine. Nonobstructive bowel gas pattern. Oval-shaped density in the left abdomen may represent an ingested tablet. Limited evaluation for free air on this supine image. IMPRESSION: Normal bowel gas pattern. Scoliosis. Electronically Signed   By: Markus Daft M.D.   On: 06/29/2017 16:04    Procedures Procedures (including critical care time)  Medications Ordered in ED Medications  potassium chloride 10 mEq in 100 mL IVPB (not administered)  potassium chloride SA (K-DUR,KLOR-CON) CR tablet 40 mEq (not administered)  potassium chloride SA (K-DUR,KLOR-CON) 20 MEQ CR tablet (not administered)  ondansetron (ZOFRAN) injection 4 mg (4 mg Intravenous Given 06/29/17 1458)    sodium chloride 0.9 % bolus 1,000 mL (1,000 mLs Intravenous New Bag/Given 06/29/17 1512)     Initial Impression / Assessment and Plan / ED Course  I have reviewed the triage vital signs and the nursing notes.  Pertinent labs & imaging results that were available during my care of the patient were reviewed by me and considered in my medical decision making (see chart for details).    Pt's sx likely due to the colonoscopy prep.  She is not volume overloaded, but contracted.  Pt given IV NS and IV/oral potassium.  Pt d/w Dr. Karleen Hampshire ([facility] at [facility]) who will admit her.  Final Clinical Impressions(s) / ED Diagnoses   Final diagnoses:  Hyponatremia  Hypokalemia  Non-intractable vomiting with nausea, unspecified vomiting type  Dehydration    New Prescriptions New Prescriptions   No medications on file     Isla Pence, MD 06/29/17 1625

## 2017-06-29 NOTE — Telephone Encounter (Signed)
The pt has been advised to go to the ED for evaluation

## 2017-06-29 NOTE — Telephone Encounter (Signed)
She needs to go to the ER 

## 2017-06-29 NOTE — Telephone Encounter (Signed)
The pt has been advised and will come in now for labs and xray.

## 2017-06-29 NOTE — ED Triage Notes (Signed)
Pt was advised by GI to come to ED for sodium level 117- also c/o n/v started yesterday-NAD-presents to triage in w/c

## 2017-06-29 NOTE — Progress Notes (Signed)
71 year old with recent colonoscopy came into [facility]  with complaints of nausea and vomiting. She was found to be hypokalemic and hyponatremic, with mental status intact. Rest of VS wnl.  Request admission to [facility] for observation for evaluation and repletion of electrolytes. ADMIT to telemetry under observation.   Hosie Poisson, MD [phone]

## 2017-06-30 DIAGNOSIS — I10 Essential (primary) hypertension: Secondary | ICD-10-CM | POA: Diagnosis not present

## 2017-06-30 DIAGNOSIS — E871 Hypo-osmolality and hyponatremia: Secondary | ICD-10-CM | POA: Diagnosis not present

## 2017-06-30 DIAGNOSIS — E860 Dehydration: Secondary | ICD-10-CM | POA: Diagnosis not present

## 2017-06-30 DIAGNOSIS — R112 Nausea with vomiting, unspecified: Secondary | ICD-10-CM

## 2017-06-30 DIAGNOSIS — E876 Hypokalemia: Secondary | ICD-10-CM | POA: Diagnosis not present

## 2017-06-30 LAB — BASIC METABOLIC PANEL
ANION GAP: 9 (ref 5–15)
ANION GAP: 8 (ref 5–15)
Anion gap: 10 (ref 5–15)
Anion gap: 9 (ref 5–15)
BUN: 6 mg/dL (ref 6–20)
BUN: 8 mg/dL (ref 6–20)
BUN: 12 mg/dL (ref 6–20)
BUN: 14 mg/dL (ref 6–20)
CALCIUM: 8.3 mg/dL — AB (ref 8.9–10.3)
CALCIUM: 8.7 mg/dL — AB (ref 8.9–10.3)
CALCIUM: 8.3 mg/dL — AB (ref 8.9–10.3)
CALCIUM: 8.7 mg/dL — AB (ref 8.9–10.3)
CO2: 23 mmol/L (ref 22–32)
CO2: 24 mmol/L (ref 22–32)
CO2: 26 mmol/L (ref 22–32)
CO2: 25 mmol/L (ref 22–32)
CREATININE: 0.44 mg/dL (ref 0.44–1.00)
CREATININE: 0.51 mg/dL (ref 0.44–1.00)
Chloride: 87 mmol/L — ABNORMAL LOW (ref 101–111)
Chloride: 90 mmol/L — ABNORMAL LOW (ref 101–111)
Chloride: 88 mmol/L — ABNORMAL LOW (ref 101–111)
Chloride: 95 mmol/L — ABNORMAL LOW (ref 101–111)
Creatinine, Ser: 0.56 mg/dL (ref 0.44–1.00)
Creatinine, Ser: 0.55 mg/dL (ref 0.44–1.00)
GFR calc Af Amer: >60 mL/min (ref >60)
GFR calc Af Amer: >60 mL/min (ref >60)
GFR calc Af Amer: >60 mL/min (ref >60)
GFR calc non Af Amer: >60 mL/min (ref >60)
GFR calc non Af Amer: >60 mL/min (ref >60)
GLUCOSE: 102 mg/dL — AB (ref 65–99)
GLUCOSE: 126 mg/dL — AB (ref 65–99)
GLUCOSE: 109 mg/dL — AB (ref 65–99)
GLUCOSE: 92 mg/dL (ref 65–99)
POTASSIUM: 3.6 mmol/L (ref 3.5–5.1)
POTASSIUM: 3.9 mmol/L (ref 3.5–5.1)
Potassium: 3.5 mmol/L (ref 3.5–5.1)
Potassium: 3.3 mmol/L — ABNORMAL LOW (ref 3.5–5.1)
SODIUM: 123 mmol/L — AB (ref 135–145)
SODIUM: 128 mmol/L — AB (ref 135–145)
Sodium: 120 mmol/L — ABNORMAL LOW (ref 135–145)
Sodium: 123 mmol/L — ABNORMAL LOW (ref 135–145)

## 2017-06-30 LAB — CBC
HCT: 34.3 % — ABNORMAL LOW (ref 36.0–46.0)
Hemoglobin: 12.7 g/dL (ref 12.0–15.0)
MCH: 30.4 pg (ref 26.0–34.0)
MCHC: 37 g/dL — ABNORMAL HIGH (ref 30.0–36.0)
MCV: 82.1 fL (ref 78.0–100.0)
PLATELETS: 258 10*3/uL (ref 150–400)
RBC: 4.18 MIL/uL (ref 3.87–5.11)
RDW: 12.7 % (ref 11.5–15.5)
WBC: 7.7 10*3/uL (ref 4.0–10.5)

## 2017-06-30 LAB — SODIUM, URINE, RANDOM: Sodium, Ur: 66 mmol/L

## 2017-06-30 LAB — OSMOLALITY, URINE: Osmolality, Ur: 233 mOsm/kg — ABNORMAL LOW (ref 300–900)

## 2017-06-30 MED ORDER — POTASSIUM CHLORIDE CRYS ER 20 MEQ PO TBCR
40.0000 meq | EXTENDED_RELEASE_TABLET | Freq: Two times a day (BID) | ORAL | Status: AC
  Administered 2017-06-30 (×2): 40 meq via ORAL
  Filled 2017-06-30 (×2): qty 2

## 2017-06-30 MED ORDER — ENOXAPARIN SODIUM 40 MG/0.4ML ~~LOC~~ SOLN
40.0000 mg | SUBCUTANEOUS | Status: DC
  Administered 2017-06-30: 40 mg via SUBCUTANEOUS
  Filled 2017-06-30: qty 0.4

## 2017-06-30 NOTE — Progress Notes (Signed)
CRITICAL VALUE ALERT  Critical Value:  Sodium 118  Date & Time Notied:  06/29/17@2211   Provider Notified: Yes  Orders Received/Actions taken: Yes

## 2017-06-30 NOTE — Care Management Obs Status (Signed)
[place] NOTIFICATION   Patient Details  Name: Melissa Mayo MRN: [id number] Date of Birth: 1945/10/05   Medicare Observation Status Notification Given:  Yes    MahabirJuliann Pulse, RN 06/30/2017, 12:25 PM

## 2017-06-30 NOTE — Progress Notes (Signed)
PROGRESS NOTE    Melissa Mayo  UMP:[id number] DOB: Nov 08, 1945 DOA: 06/29/2017 PCP: Deland Pretty, MD    Brief Narrative: Melissa Mayo is a 71 y.o. female with medical history significant for hypertension, now presenting to the emergency department for evaluation of nausea, vomiting, and malaise. She was found to be severe hyponatremic and hypokalemic.   Assessment & Plan:   Principal Problem:   Hyponatremia Active Problems:   Hypertension   Hypokalemia   Prolonged QT interval   Non-intractable vomiting with nausea  Hyponatremia, hypokalemia would explain the  Malaise , nausea, vomiting, improving with fluids.  Sodium is 123 today and potassium is 3.3  Repeat lytes this pm.  Ambulate withPT later today.  Orthostatics as she reports persistent dizziness.    Hypertension:  Controlled.    DVT prophylaxis: lovenox.  Code Status: full code.  Family Communication: family at bedside.  Disposition Plan: home in am if sodium is back to baseline.   Consultants:   None.    Procedures: none.    Antimicrobials: none.    Subjective: No new complaints.   Objective: Vitals:   06/29/17 1845 06/29/17 2038 06/30/17 0423 06/30/17 1306  BP: (!) 165/77 (!) 161/74 (!) 147/77 132/62  Pulse: 70 70 67 66  Resp: 18 18 16 18   Temp:  98.1 F (36.7 C) 98.3 F (36.8 C) 98.2 F (36.8 C)  TempSrc:  Oral Oral Oral  SpO2: 100% 97% 98% 100%  Weight:   49.4 kg (108 lb 14.5 oz)   Height:        Intake/Output Summary (Last 24 hours) at 06/30/17 1538 Last data filed at 06/30/17 1210  Gross per 24 hour  Intake              987 ml  Output             1500 ml  Net             -513 ml   Filed Weights   06/29/17 1355 06/30/17 0423  Weight: 52.2 kg (115 lb) 49.4 kg (108 lb 14.5 oz)    Examination:  General exam: Appears calm and comfortable  Respiratory system: Clear to auscultation. Respiratory effort normal. Cardiovascular system: S1 & S2 heard, RRR. No JVD, murmurs, rubs,  gallops or clicks. No pedal edema. Gastrointestinal system: Abdomen is nondistended, soft and nontender. No organomegaly or masses felt. Normal bowel sounds heard. Central nervous system: Alert and oriented. No focal neurological deficits. Extremities: Symmetric 5 x 5 power. Skin: No rashes, lesions or ulcers Psychiatry: Judgement and insight appear normal. Mood & affect appropriate.     Data Reviewed: I have personally reviewed following labs and imaging studies  CBC:  Recent Labs Lab 06/29/17 1019 06/29/17 1449 06/30/17 0311  WBC 8.1 7.0 7.7  NEUTROABS 7.3  --   --   HGB 13.2 12.4 12.7  HCT 37.8 34.1* 34.3*  MCV 87.0 82.2 82.1  PLT 309.0 295 400   Basic Metabolic Panel:  Recent Labs Lab 06/29/17 1449 06/29/17 2115 06/30/17 0311 06/30/17 0843 06/30/17 1426  NA 115* 118* 120* 123* 123*  K 2.6* 2.8* 3.5 3.3* 3.6  CL 79* 82* 87* 90* 88*  CO2 24 25 23 24 26   GLUCOSE 126* 108* 102* 126* 109*  BUN 8 6 6 8 12   CREATININE 0.41* 0.53 0.44 0.51 0.56  CALCIUM 9.0 8.5* 8.3* 8.7* 8.3*   GFR: Estimated Creatinine Clearance: 49.6 mL/min (by C-G formula based on SCr of 0.56 mg/dL). Liver Function  Tests:  Recent Labs Lab 06/29/17 1019 06/29/17 1449  AST 17 29  ALT 11 15  ALKPHOS 74 74  BILITOT 1.1 1.3*  PROT 7.3 7.4  ALBUMIN 4.3 4.1   No results for input(s): LIPASE, AMYLASE in the last 168 hours. No results for input(s): AMMONIA in the last 168 hours. Coagulation Profile: No results for input(s): INR, PROTIME in the last 168 hours. Cardiac Enzymes: No results for input(s): CKTOTAL, CKMB, CKMBINDEX, TROPONINI in the last 168 hours. BNP (last 3 results) No results for input(s): PROBNP in the last 8760 hours. HbA1C: No results for input(s): HGBA1C in the last 72 hours. CBG: No results for input(s): GLUCAP in the last 168 hours. Lipid Profile: No results for input(s): CHOL, HDL, LDLCALC, TRIG, CHOLHDL, LDLDIRECT in the last 72 hours. Thyroid Function Tests: No  results for input(s): TSH, T4TOTAL, FREET4, T3FREE, THYROIDAB in the last 72 hours. Anemia Panel: No results for input(s): VITAMINB12, FOLATE, FERRITIN, TIBC, IRON, RETICCTPCT in the last 72 hours. Sepsis Labs: No results for input(s): PROCALCITON, LATICACIDVEN in the last 168 hours.  No results found for this or any previous visit (from the past 240 hour(s)).       Radiology Studies: Dg Abd 1 View  Result Date: 06/29/2017 CLINICAL DATA:  Nausea and weakness.  Soreness after colonoscopy. EXAM: ABDOMEN - 1 VIEW COMPARISON:  None. FINDINGS: Dextroscoliosis of the lumbar spine. Nonobstructive bowel gas pattern. Oval-shaped density in the left abdomen may represent an ingested tablet. Limited evaluation for free air on this supine image. IMPRESSION: Normal bowel gas pattern. Scoliosis. Electronically Signed   By: Markus Daft M.D.   On: 06/29/2017 16:04   Ct Head Wo Contrast  Result Date: 06/29/2017 CLINICAL DATA:  Altered mental status, nausea. EXAM: CT HEAD WITHOUT CONTRAST TECHNIQUE: Contiguous axial images were obtained from the base of the skull through the vertex without intravenous contrast. COMPARISON:  None. FINDINGS: Brain: There is atrophy and chronic small vessel disease changes. No acute intracranial abnormality. Specifically, no hemorrhage, hydrocephalus, mass lesion, acute infarction, or significant intracranial injury. Vascular: No hyperdense vessel or unexpected calcification. Skull: No acute calvarial abnormality. Sinuses/Orbits: No acute finding. Other: None IMPRESSION: No acute intracranial abnormality. Atrophy, chronic microvascular disease. Electronically Signed   By: Rolm Baptise M.D.   On: 06/29/2017 17:55        Scheduled Meds: . atenolol  100 mg Oral Daily  . calcium-vitamin D  2 tablet Oral Q breakfast  . enoxaparin (LOVENOX) injection  40 mg Subcutaneous Q24H  . loratadine  10 mg Oral Daily  . potassium chloride  40 mEq Oral BID  . sodium chloride flush  3 mL  Intravenous Q12H  . sodium chloride flush  3 mL Intravenous Q12H  . cyanocobalamin  1,000 mcg Oral Daily   Continuous Infusions: . sodium chloride       LOS: 0 days    Time spent: 35 minutes.     Hosie Poisson, MD [facility] Pager [phone]   If 7PM-7AM, please contact night-coverage [email or web address] Password [facility] 06/30/2017, 3:38 PM

## 2017-06-30 NOTE — Care Management Note (Signed)
Case Management Note  Patient Details  Name: Melissa Mayo MRN: [id number] Date of Birth: 1946-07-16  Subjective/Objective:   71 y/o f admitted w/Hyponatremia. From home w/spouse.                  Action/Plan:d/c home.   Expected Discharge Date:                  Expected Discharge Plan:  Home/Self Care  In-House Referral:     Discharge planning Services  CM Consult  Post Acute Care Choice:    Choice offered to:     DME Arranged:    DME Agency:     HH Arranged:    HH Agency:     Status of Service:  In process, will continue to follow  If discussed at Long Length of Stay Meetings, dates discussed:    Additional Comments:  Dessa Phi, RN 06/30/2017, 12:23 PM

## 2017-07-01 DIAGNOSIS — E876 Hypokalemia: Secondary | ICD-10-CM | POA: Diagnosis not present

## 2017-07-01 DIAGNOSIS — E871 Hypo-osmolality and hyponatremia: Secondary | ICD-10-CM | POA: Diagnosis not present

## 2017-07-01 DIAGNOSIS — I10 Essential (primary) hypertension: Secondary | ICD-10-CM | POA: Diagnosis not present

## 2017-07-01 DIAGNOSIS — R112 Nausea with vomiting, unspecified: Secondary | ICD-10-CM | POA: Diagnosis not present

## 2017-07-01 DIAGNOSIS — E860 Dehydration: Secondary | ICD-10-CM | POA: Diagnosis not present

## 2017-07-01 LAB — BASIC METABOLIC PANEL
ANION GAP: 6 (ref 5–15)
BUN: 13 mg/dL (ref 6–20)
CALCIUM: 8.9 mg/dL (ref 8.9–10.3)
CO2: 26 mmol/L (ref 22–32)
CREATININE: 0.61 mg/dL (ref 0.44–1.00)
Chloride: 99 mmol/L — ABNORMAL LOW (ref 101–111)
GFR calc Af Amer: >60 mL/min (ref >60)
GLUCOSE: 120 mg/dL — AB (ref 65–99)
Potassium: 3.9 mmol/L (ref 3.5–5.1)
Sodium: 131 mmol/L — ABNORMAL LOW (ref 135–145)

## 2017-07-01 MED ORDER — ATENOLOL 100 MG PO TABS: 100.0000 mg | ORAL_TABLET | Freq: Every day | ORAL | 0 refills | Status: AC

## 2017-07-01 NOTE — Evaluation (Signed)
Physical Therapy Evaluation Patient Details Name: Melissa Mayo MRN: [id number] DOB: April 18, 1946 Today's Date: 07/01/2017   History of Present Illness  Pt admitted through ED with N/V and found to be hyponatremic and hypokalemic.  Clinical Impression  Pt admitted as above and presenting with functional mobility limitations 2* generalized weakness and mild ambulatory balance deficits.  Pt should progress to dc home with family assist.    Follow Up Recommendations No PT follow up    Equipment Recommendations  None recommended by PT    Recommendations for Other Services       Precautions / Restrictions Precautions Precautions: Fall Restrictions Weight Bearing Restrictions: No      Mobility  Bed Mobility Overal bed mobility: Modified Independent             General bed mobility comments: Pt unassisted supine to sit  Transfers Overall transfer level: Needs assistance Equipment used: None Transfers: Sit to/from Stand Sit to Stand: Supervision         General transfer comment: for safety  Ambulation/Gait Ambulation/Gait assistance: Min guard;Supervision Ambulation Distance (Feet): 400 Feet Assistive device: Rolling walker (2 wheeled);None Gait Pattern/deviations: Step-through pattern;Decreased step length - right;Decreased step length - left;Shuffle;Narrow base of support Gait velocity: decr Gait velocity interpretation: Below normal speed for age/gender General Gait Details: Pt ambulating with mild instability and able to step fwd, bkwd and sideways with no loss of balance.  Stability improved with use of RW.  Pt ambulated 200' with min guard for safety and additional 200' with RW and sup  Stairs            Wheelchair Mobility    Modified Rankin (Stroke Patients Only)       Balance Overall balance assessment: Needs assistance Sitting-balance support: No upper extremity supported;Feet supported Sitting balance-Leahy Scale: Normal     Standing  balance support: No upper extremity supported Standing balance-Leahy Scale: Good                               Pertinent Vitals/Pain Pain Assessment: No/denies pain    Home Living Family/patient expects to be discharged to:: Private residence Living Arrangements: Spouse/significant other Available Help at Discharge: Family Type of Home: House Home Access: Stairs to enter Entrance Stairs-Rails: Right;Left;Can reach both Technical brewer of Steps: 4 Home Layout: One level Home Equipment: None Additional Comments: Pt states can borrow equipment from friend    Prior Function Level of Independence: Independent         Comments: Pt reports walking 45 minutes a day prior to this episode     Hand Dominance        Extremity/Trunk Assessment   Upper Extremity Assessment Upper Extremity Assessment: Generalized weakness    Lower Extremity Assessment Lower Extremity Assessment: Generalized weakness       Communication   Communication: No difficulties  Cognition Arousal/Alertness: Awake/alert Behavior During Therapy: WFL for tasks assessed/performed Overall Cognitive Status: Within Functional Limits for tasks assessed                                        General Comments      Exercises     Assessment/Plan    PT Assessment Patient needs continued PT services  PT Problem List Decreased strength;Decreased activity tolerance;Decreased balance;Decreased knowledge of use of DME       PT Treatment  Interventions DME instruction;Gait training;Stair training;Functional mobility training;Therapeutic activities;Therapeutic exercise;Balance training;Patient/family education    PT Goals (Current goals can be found in the Care Plan section)  Acute Rehab PT Goals Patient Stated Goal: Regain strength and IND PT Goal Formulation: With patient Time For Goal Achievement: 07/09/17 Potential to Achieve Goals: Good    Frequency Min 3X/week    Barriers to discharge        Co-evaluation               AM-PAC PT "6 Clicks" Daily Activity  Outcome Measure Difficulty turning over in bed (including adjusting bedclothes, sheets and blankets)?: None Difficulty moving from lying on back to sitting on the side of the bed? : None Difficulty sitting down on and standing up from a chair with arms (e.g., wheelchair, bedside commode, etc,.)?: A Little Help needed moving to and from a bed to chair (including a wheelchair)?: A Little Help needed walking in hospital room?: A Little Help needed climbing 3-5 steps with a railing? : A Little 6 Click Score: 20    End of Session Equipment Utilized During Treatment: Gait belt Activity Tolerance: Patient tolerated treatment well Patient left: in chair;with call bell/phone within reach Nurse Communication: Mobility status PT Visit Diagnosis: Unsteadiness on feet (R26.81)    Time: 0076-2263 PT Time Calculation (min) (ACUTE ONLY): 23 min   Charges:   PT Evaluation $PT Eval Low Complexity: 1 Low     PT G Codes:   PT G-Codes **NOT FOR INPATIENT CLASS** Functional Assessment Tool Used: Clinical judgement Functional Limitation: Mobility: Walking and moving around Mobility: Walking and Moving Around Current Status (F3545): At least 20 percent but less than 40 percent impaired, limited or restricted Mobility: Walking and Moving Around Goal Status [id number]): At least 1 percent but less than 20 percent impaired, limited or restricted    Pg [phone]   Clydell Alberts 07/01/2017, 12:06 PM

## 2017-07-01 NOTE — Progress Notes (Signed)
Orthostatic vitals are in the chart.

## 2017-07-01 NOTE — Progress Notes (Signed)
Patient being discharged to home with husband at bedside. VSS, IV discontinued, AVS reviewed and prescriptions received. Wheelchair transport to vehicle.

## 2017-07-04 NOTE — Discharge Summary (Signed)
Physician Discharge Summary  Melissa Mayo VOZ:[id number] DOB: 04-12-1946 DOA: 06/29/2017  PCP: Deland Pretty, MD  Admit date: 06/29/2017 Discharge date: 07/01/2017  Admitted From: Home.  Disposition:  Home.  Recommendations for Outpatient Follow-up:  1. Follow up with PCP in 1-2 weeks 2. Please obtain BMP/CBC in one week   Discharge Condition: stable.  CODE STATUS: full code.  Diet recommendation: Heart Healthy  Brief/Interim Summary: Melissa Norwoodis a 71 y.o.femalewith medical history significant for hypertension, now presenting to the emergency department for evaluation of nausea, vomiting, and malaise. She was found to be severe hyponatremic and hypokalemic.   Discharge Diagnoses:  Principal Problem:   Hyponatremia Active Problems:   Hypertension   Hypokalemia   Prolonged QT interval   Non-intractable vomiting with nausea  Hyponatremia, hypokalemia would explain the  Malaise , nausea, vomiting, improving with fluids.  Sodium is 131 today and potassium is  Normalized.   her symptoms resolved.    Hypertension:  Controlled.    Discharge Instructions  Discharge Instructions    Diet general    Complete by:  As directed    Discharge instructions    Complete by:  As directed    Follow up with pcp in one week and check sodium level.     Allergies as of 07/01/2017   No Known Allergies     Medication List    STOP taking these medications   atenolol-chlorthalidone 100-25 MG tablet Commonly known as:  TENORETIC Replaced by:  atenolol 100 MG tablet   ibuprofen 200 MG tablet Commonly known as:  ADVIL,MOTRIN     TAKE these medications   atenolol 100 MG tablet Commonly known as:  TENORMIN Take 1 tablet (100 mg total) by mouth daily. Replaces:  atenolol-chlorthalidone 100-25 MG tablet   calcium citrate-vitamin D 315-200 MG-UNIT tablet Commonly known as:  CITRACAL+D Take 2 tablets by mouth daily.   cetirizine 10 MG tablet Commonly known as:   ZYRTEC Take 10 mg by mouth daily as needed for allergies.   cyanocobalamin 1000 MCG tablet Take 1,000 mcg by mouth daily.   metroNIDAZOLE 0.75 % cream Commonly known as:  METROCREAM Apply 1 application topically 2 (two) times daily.   potassium chloride 10 MEQ tablet Commonly known as:  K-DUR Take 10 mEq by mouth daily.   PROBIOTIC DAILY PO Take 1 capsule by mouth daily.       No Known Allergies  Consultations:  None.    Procedures/Studies: Dg Abd 1 View  Result Date: 06/29/2017 CLINICAL DATA:  Nausea and weakness.  Soreness after colonoscopy. EXAM: ABDOMEN - 1 VIEW COMPARISON:  None. FINDINGS: Dextroscoliosis of the lumbar spine. Nonobstructive bowel gas pattern. Oval-shaped density in the left abdomen may represent an ingested tablet. Limited evaluation for free air on this supine image. IMPRESSION: Normal bowel gas pattern. Scoliosis. Electronically Signed   By: Markus Daft M.D.   On: 06/29/2017 16:04   Ct Head Wo Contrast  Result Date: 06/29/2017 CLINICAL DATA:  Altered mental status, nausea. EXAM: CT HEAD WITHOUT CONTRAST TECHNIQUE: Contiguous axial images were obtained from the base of the skull through the vertex without intravenous contrast. COMPARISON:  None. FINDINGS: Brain: There is atrophy and chronic small vessel disease changes. No acute intracranial abnormality. Specifically, no hemorrhage, hydrocephalus, mass lesion, acute infarction, or significant intracranial injury. Vascular: No hyperdense vessel or unexpected calcification. Skull: No acute calvarial abnormality. Sinuses/Orbits: No acute finding. Other: None IMPRESSION: No acute intracranial abnormality. Atrophy, chronic microvascular disease. Electronically Signed   By: Lennette Bihari  [place] M.D.   On: 06/29/2017 17:55       Subjective: No new complaints.   Discharge Exam: Vitals:   07/01/17 0844 07/01/17 1248  BP: (!) 133/56 (!) 143/58  Pulse: 67 68  Resp:    Temp: 97.7 F (36.5 C) 97.8 F (36.6 C)   SpO2: 99% 98%   Vitals:   06/30/17 2043 07/01/17 0450 07/01/17 0844 07/01/17 1248  BP: (!) 143/64 133/74 (!) 133/56 (!) 143/58  Pulse: 60 61 67 68  Resp: 18 18    Temp: 98.3 F (36.8 C) 98 F (36.7 C) 97.7 F (36.5 C) 97.8 F (36.6 C)  TempSrc: Oral Oral Oral Oral  SpO2: 99%  99% 98%  Weight:  49.7 kg (109 lb 9.1 oz)    Height:        General: Pt is alert, awake, not in acute distress Cardiovascular: RRR, S1/S2 +, no rubs, no gallops Respiratory: CTA bilaterally, no wheezing, no rhonchi Abdominal: Soft, NT, ND, bowel sounds + Extremities: no edema, no cyanosis    The results of significant diagnostics from this hospitalization (including imaging, microbiology, ancillary and laboratory) are listed below for reference.     Microbiology: No results found for this or any previous visit (from the past 240 hour(s)).   Labs: BNP (last 3 results) No results for input(s): BNP in the last 8760 hours. Basic Metabolic Panel:  Recent Labs Lab 06/30/17 0311 06/30/17 0843 06/30/17 1426 06/30/17 2027 07/01/17 0916  NA 120* 123* 123* 128* 131*  K 3.5 3.3* 3.6 3.9 3.9  CL 87* 90* 88* 95* 99*  CO2 23 24 26 25 26   GLUCOSE 102* 126* 109* 92 120*  BUN 6 8 12 14 13   CREATININE 0.44 0.51 0.56 0.55 0.61  CALCIUM 8.3* 8.7* 8.3* 8.7* 8.9   Liver Function Tests:  Recent Labs Lab 06/29/17 1019 06/29/17 1449  AST 17 29  ALT 11 15  ALKPHOS 74 74  BILITOT 1.1 1.3*  PROT 7.3 7.4  ALBUMIN 4.3 4.1   No results for input(s): LIPASE, AMYLASE in the last 168 hours. No results for input(s): AMMONIA in the last 168 hours. CBC:  Recent Labs Lab 06/29/17 1019 06/29/17 1449 06/30/17 0311  WBC 8.1 7.0 7.7  NEUTROABS 7.3  --   --   HGB 13.2 12.4 12.7  HCT 37.8 34.1* 34.3*  MCV 87.0 82.2 82.1  PLT 309.0 295 258   Cardiac Enzymes: No results for input(s): CKTOTAL, CKMB, CKMBINDEX, TROPONINI in the last 168 hours. BNP: Invalid input(s): POCBNP CBG: No results for input(s):  GLUCAP in the last 168 hours. D-Dimer No results for input(s): DDIMER in the last 72 hours. Hgb A1c No results for input(s): HGBA1C in the last 72 hours. Lipid Profile No results for input(s): CHOL, HDL, LDLCALC, TRIG, CHOLHDL, LDLDIRECT in the last 72 hours. Thyroid function studies No results for input(s): TSH, T4TOTAL, T3FREE, THYROIDAB in the last 72 hours.  Invalid input(s): FREET3 Anemia work up No results for input(s): VITAMINB12, FOLATE, FERRITIN, TIBC, IRON, RETICCTPCT in the last 72 hours. Urinalysis    Component Value Date/Time   COLORURINE YELLOW 06/29/2017 1615   APPEARANCEUR CLOUDY (A) 06/29/2017 1615   LABSPEC 1.010 06/29/2017 1615   PHURINE 7.5 06/29/2017 1615   GLUCOSEU NEGATIVE 06/29/2017 1615   HGBUR SMALL (A) 06/29/2017 1615   BILIRUBINUR NEGATIVE 06/29/2017 1615   KETONESUR 15 (A) 06/29/2017 1615   PROTEINUR NEGATIVE 06/29/2017 1615   NITRITE NEGATIVE 06/29/2017 [place] 06/29/2017 1615  Sepsis Labs Invalid input(s): PROCALCITONIN,  WBC,  LACTICIDVEN Microbiology No results found for this or any previous visit (from the past 240 hour(s)).   Time coordinating discharge: Over 30 minutes  SIGNED:   Hosie Poisson, MD  [facility] 07/04/2017, 8:27 AM Pager   If 7PM-7AM, please contact night-coverage [email or web address] Password [email or web address]

## 2017-07-12 DIAGNOSIS — Z23 Encounter for immunization: Secondary | ICD-10-CM | POA: Diagnosis not present

## 2017-07-12 DIAGNOSIS — I10 Essential (primary) hypertension: Secondary | ICD-10-CM | POA: Diagnosis not present

## 2017-07-12 DIAGNOSIS — E876 Hypokalemia: Secondary | ICD-10-CM | POA: Diagnosis not present

## 2017-07-12 DIAGNOSIS — E871 Hypo-osmolality and hyponatremia: Secondary | ICD-10-CM | POA: Diagnosis not present

## 2017-08-10 DIAGNOSIS — I10 Essential (primary) hypertension: Secondary | ICD-10-CM | POA: Diagnosis not present

## 2017-11-09 DIAGNOSIS — I10 Essential (primary) hypertension: Secondary | ICD-10-CM | POA: Diagnosis not present

## 2018-01-27 DIAGNOSIS — R69 Illness, unspecified: Secondary | ICD-10-CM | POA: Diagnosis not present

## 2018-02-10 DIAGNOSIS — D2261 Melanocytic nevi of right upper limb, including shoulder: Secondary | ICD-10-CM | POA: Diagnosis not present

## 2018-02-10 DIAGNOSIS — L718 Other rosacea: Secondary | ICD-10-CM | POA: Diagnosis not present

## 2018-02-10 DIAGNOSIS — L821 Other seborrheic keratosis: Secondary | ICD-10-CM | POA: Diagnosis not present

## 2018-02-10 DIAGNOSIS — D2239 Melanocytic nevi of other parts of face: Secondary | ICD-10-CM | POA: Diagnosis not present

## 2018-02-10 DIAGNOSIS — L820 Inflamed seborrheic keratosis: Secondary | ICD-10-CM | POA: Diagnosis not present

## 2018-02-10 DIAGNOSIS — D1801 Hemangioma of skin and subcutaneous tissue: Secondary | ICD-10-CM | POA: Diagnosis not present

## 2018-03-27 DIAGNOSIS — Z961 Presence of intraocular lens: Secondary | ICD-10-CM | POA: Diagnosis not present

## 2018-03-27 DIAGNOSIS — H52223 Regular astigmatism, bilateral: Secondary | ICD-10-CM | POA: Diagnosis not present

## 2018-03-27 DIAGNOSIS — H524 Presbyopia: Secondary | ICD-10-CM | POA: Diagnosis not present

## 2018-03-27 DIAGNOSIS — H5213 Myopia, bilateral: Secondary | ICD-10-CM | POA: Diagnosis not present

## 2018-05-12 DIAGNOSIS — E559 Vitamin D deficiency, unspecified: Secondary | ICD-10-CM | POA: Diagnosis not present

## 2018-05-12 DIAGNOSIS — I10 Essential (primary) hypertension: Secondary | ICD-10-CM | POA: Diagnosis not present

## 2018-05-17 DIAGNOSIS — I10 Essential (primary) hypertension: Secondary | ICD-10-CM | POA: Diagnosis not present

## 2018-05-17 DIAGNOSIS — Z1212 Encounter for screening for malignant neoplasm of rectum: Secondary | ICD-10-CM | POA: Diagnosis not present

## 2018-05-17 DIAGNOSIS — Z01419 Encounter for gynecological examination (general) (routine) without abnormal findings: Secondary | ICD-10-CM | POA: Diagnosis not present

## 2018-05-17 DIAGNOSIS — Z0000 Encounter for general adult medical examination without abnormal findings: Secondary | ICD-10-CM | POA: Diagnosis not present

## 2018-06-16 DIAGNOSIS — Z1231 Encounter for screening mammogram for malignant neoplasm of breast: Secondary | ICD-10-CM | POA: Diagnosis not present

## 2018-07-19 DIAGNOSIS — Z23 Encounter for immunization: Secondary | ICD-10-CM | POA: Diagnosis not present

## 2018-09-04 DIAGNOSIS — I10 Essential (primary) hypertension: Secondary | ICD-10-CM | POA: Diagnosis not present

## 2018-09-04 DIAGNOSIS — J0100 Acute maxillary sinusitis, unspecified: Secondary | ICD-10-CM | POA: Diagnosis not present

## 2018-10-27 DIAGNOSIS — R69 Illness, unspecified: Secondary | ICD-10-CM | POA: Diagnosis not present

## 2018-11-17 DIAGNOSIS — I10 Essential (primary) hypertension: Secondary | ICD-10-CM | POA: Diagnosis not present

## 2019-03-05 DIAGNOSIS — R69 Illness, unspecified: Secondary | ICD-10-CM | POA: Diagnosis not present

## 2019-03-09 DIAGNOSIS — L718 Other rosacea: Secondary | ICD-10-CM | POA: Diagnosis not present

## 2019-03-09 DIAGNOSIS — D1801 Hemangioma of skin and subcutaneous tissue: Secondary | ICD-10-CM | POA: Diagnosis not present

## 2019-03-09 DIAGNOSIS — L820 Inflamed seborrheic keratosis: Secondary | ICD-10-CM | POA: Diagnosis not present

## 2019-03-09 DIAGNOSIS — D2239 Melanocytic nevi of other parts of face: Secondary | ICD-10-CM | POA: Diagnosis not present

## 2019-03-09 DIAGNOSIS — L821 Other seborrheic keratosis: Secondary | ICD-10-CM | POA: Diagnosis not present

## 2019-04-02 DIAGNOSIS — H52223 Regular astigmatism, bilateral: Secondary | ICD-10-CM | POA: Diagnosis not present

## 2019-04-02 DIAGNOSIS — H524 Presbyopia: Secondary | ICD-10-CM | POA: Diagnosis not present

## 2019-04-02 DIAGNOSIS — H5213 Myopia, bilateral: Secondary | ICD-10-CM | POA: Diagnosis not present

## 2019-05-04 DIAGNOSIS — R69 Illness, unspecified: Secondary | ICD-10-CM | POA: Diagnosis not present

## 2019-05-18 DIAGNOSIS — E559 Vitamin D deficiency, unspecified: Secondary | ICD-10-CM | POA: Diagnosis not present

## 2019-05-18 DIAGNOSIS — I10 Essential (primary) hypertension: Secondary | ICD-10-CM | POA: Diagnosis not present

## 2019-05-18 DIAGNOSIS — Z0000 Encounter for general adult medical examination without abnormal findings: Secondary | ICD-10-CM | POA: Diagnosis not present

## 2019-05-23 DIAGNOSIS — Z0000 Encounter for general adult medical examination without abnormal findings: Secondary | ICD-10-CM | POA: Diagnosis not present

## 2019-05-23 DIAGNOSIS — Z23 Encounter for immunization: Secondary | ICD-10-CM | POA: Diagnosis not present

## 2019-05-23 DIAGNOSIS — Z1212 Encounter for screening for malignant neoplasm of rectum: Secondary | ICD-10-CM | POA: Diagnosis not present

## 2019-05-23 DIAGNOSIS — M8589 Other specified disorders of bone density and structure, multiple sites: Secondary | ICD-10-CM | POA: Diagnosis not present

## 2019-05-23 DIAGNOSIS — I10 Essential (primary) hypertension: Secondary | ICD-10-CM | POA: Diagnosis not present

## 2019-05-23 DIAGNOSIS — M85859 Other specified disorders of bone density and structure, unspecified thigh: Secondary | ICD-10-CM | POA: Diagnosis not present

## 2019-05-23 DIAGNOSIS — Z01419 Encounter for gynecological examination (general) (routine) without abnormal findings: Secondary | ICD-10-CM | POA: Diagnosis not present

## 2019-06-12 DIAGNOSIS — I10 Essential (primary) hypertension: Secondary | ICD-10-CM | POA: Diagnosis not present

## 2019-06-12 DIAGNOSIS — M85859 Other specified disorders of bone density and structure, unspecified thigh: Secondary | ICD-10-CM | POA: Diagnosis not present

## 2019-06-22 DIAGNOSIS — R69 Illness, unspecified: Secondary | ICD-10-CM | POA: Diagnosis not present

## 2019-06-29 DIAGNOSIS — Z1231 Encounter for screening mammogram for malignant neoplasm of breast: Secondary | ICD-10-CM | POA: Diagnosis not present

## 2019-07-26 IMAGING — DX DG ABDOMEN 1V
1 series · 1 of 1 positions shown · non-contrast
Comparison: None.

CLINICAL DATA: Nausea and weakness.  Soreness after colonoscopy.

EXAM:
ABDOMEN - 1 VIEW

[abdomen kub]
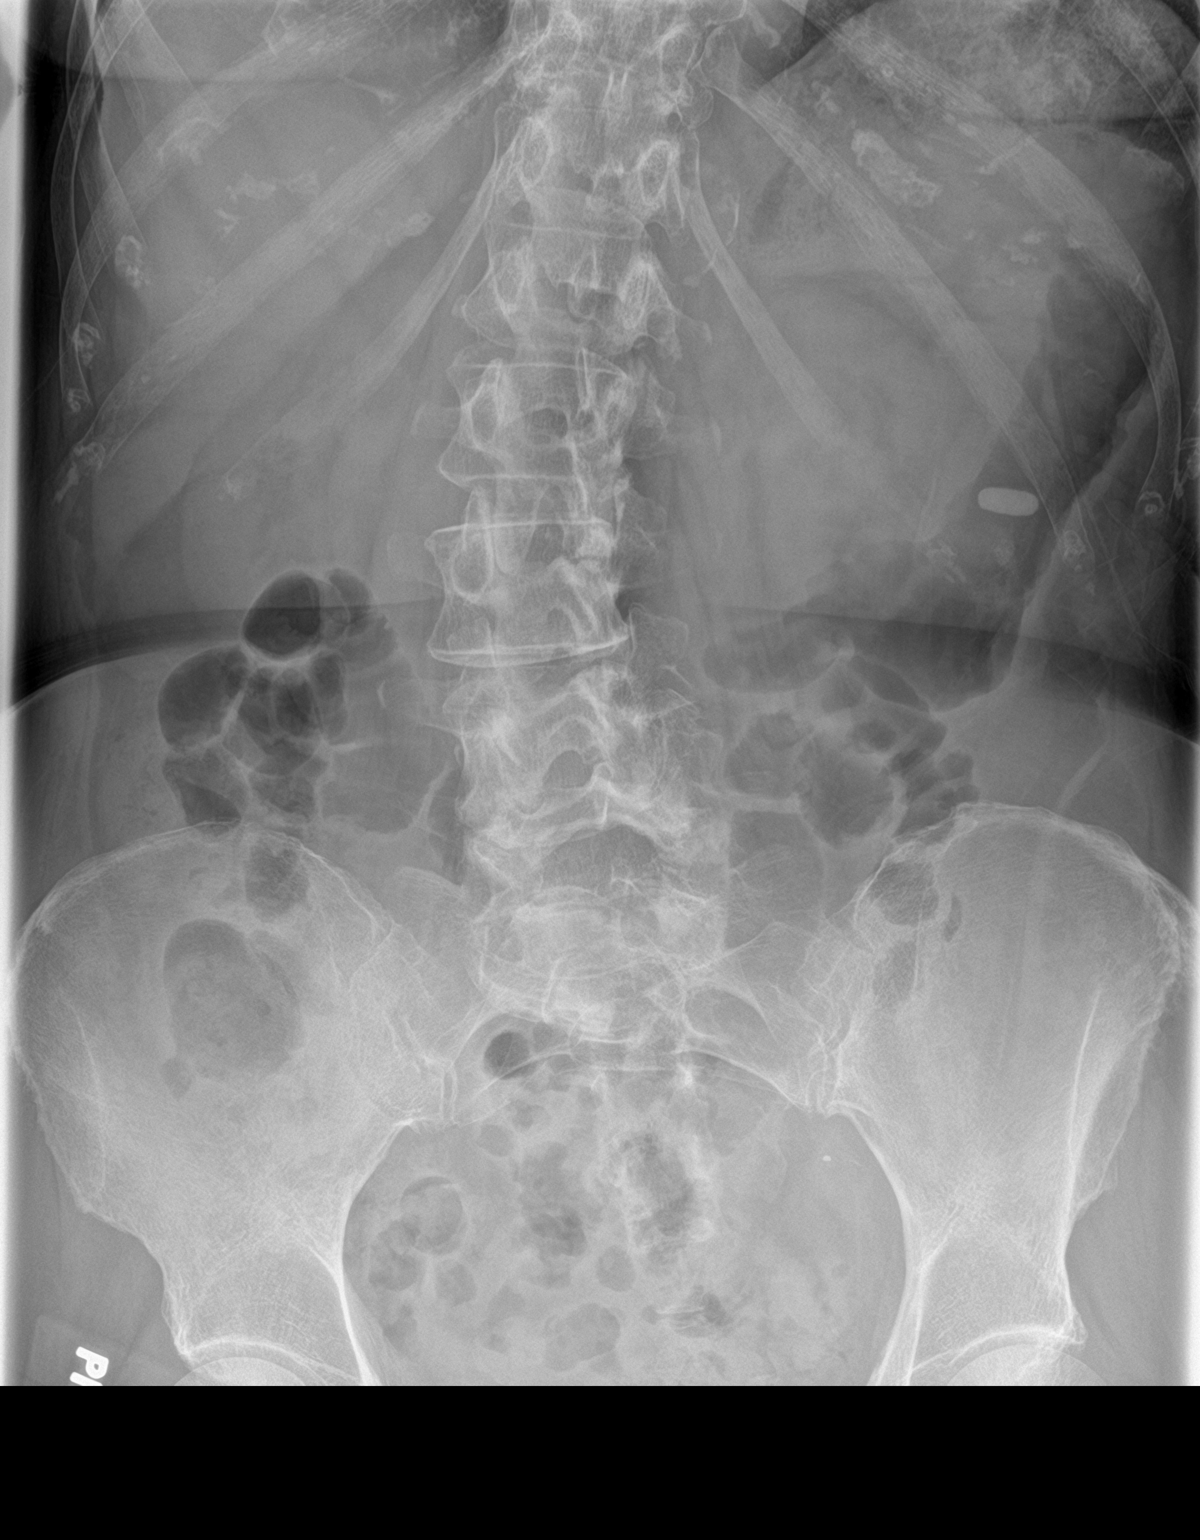

[1 of 1 positions shown; findings below may reference images not displayed]

FINDINGS: Dextroscoliosis of the lumbar spine. Nonobstructive bowel gas
pattern. Oval-shaped density in the left abdomen may represent an
ingested tablet. Limited evaluation for free air on this supine
image.
IMPRESSION: Normal bowel gas pattern.

Scoliosis.

## 2019-11-13 DIAGNOSIS — R21 Rash and other nonspecific skin eruption: Secondary | ICD-10-CM | POA: Diagnosis not present

## 2019-11-13 DIAGNOSIS — I10 Essential (primary) hypertension: Secondary | ICD-10-CM | POA: Diagnosis not present

## 2019-11-13 DIAGNOSIS — M810 Age-related osteoporosis without current pathological fracture: Secondary | ICD-10-CM | POA: Diagnosis not present

## 2020-03-14 DIAGNOSIS — L718 Other rosacea: Secondary | ICD-10-CM | POA: Diagnosis not present

## 2020-03-14 DIAGNOSIS — D2239 Melanocytic nevi of other parts of face: Secondary | ICD-10-CM | POA: Diagnosis not present

## 2020-03-14 DIAGNOSIS — L578 Other skin changes due to chronic exposure to nonionizing radiation: Secondary | ICD-10-CM | POA: Diagnosis not present

## 2020-03-14 DIAGNOSIS — D2261 Melanocytic nevi of right upper limb, including shoulder: Secondary | ICD-10-CM | POA: Diagnosis not present

## 2020-03-14 DIAGNOSIS — L738 Other specified follicular disorders: Secondary | ICD-10-CM | POA: Diagnosis not present

## 2020-03-14 DIAGNOSIS — L820 Inflamed seborrheic keratosis: Secondary | ICD-10-CM | POA: Diagnosis not present

## 2020-03-14 DIAGNOSIS — L821 Other seborrheic keratosis: Secondary | ICD-10-CM | POA: Diagnosis not present

## 2020-03-14 DIAGNOSIS — D1801 Hemangioma of skin and subcutaneous tissue: Secondary | ICD-10-CM | POA: Diagnosis not present

## 2020-03-14 DIAGNOSIS — D225 Melanocytic nevi of trunk: Secondary | ICD-10-CM | POA: Diagnosis not present

## 2020-04-07 DIAGNOSIS — H52223 Regular astigmatism, bilateral: Secondary | ICD-10-CM | POA: Diagnosis not present

## 2020-04-07 DIAGNOSIS — H5213 Myopia, bilateral: Secondary | ICD-10-CM | POA: Diagnosis not present

## 2020-04-07 DIAGNOSIS — H524 Presbyopia: Secondary | ICD-10-CM | POA: Diagnosis not present

## 2020-04-17 DIAGNOSIS — Z0100 Encounter for examination of eyes and vision without abnormal findings: Secondary | ICD-10-CM | POA: Diagnosis not present

## 2020-05-23 DIAGNOSIS — Z0000 Encounter for general adult medical examination without abnormal findings: Secondary | ICD-10-CM | POA: Diagnosis not present

## 2020-05-23 DIAGNOSIS — M85859 Other specified disorders of bone density and structure, unspecified thigh: Secondary | ICD-10-CM | POA: Diagnosis not present

## 2020-05-23 DIAGNOSIS — M8580 Other specified disorders of bone density and structure, unspecified site: Secondary | ICD-10-CM | POA: Diagnosis not present

## 2020-05-23 DIAGNOSIS — E559 Vitamin D deficiency, unspecified: Secondary | ICD-10-CM | POA: Diagnosis not present

## 2020-05-23 DIAGNOSIS — I10 Essential (primary) hypertension: Secondary | ICD-10-CM | POA: Diagnosis not present

## 2020-05-28 DIAGNOSIS — M810 Age-related osteoporosis without current pathological fracture: Secondary | ICD-10-CM | POA: Diagnosis not present

## 2020-05-28 DIAGNOSIS — Z01419 Encounter for gynecological examination (general) (routine) without abnormal findings: Secondary | ICD-10-CM | POA: Diagnosis not present

## 2020-05-28 DIAGNOSIS — Z0000 Encounter for general adult medical examination without abnormal findings: Secondary | ICD-10-CM | POA: Diagnosis not present

## 2020-05-28 DIAGNOSIS — I10 Essential (primary) hypertension: Secondary | ICD-10-CM | POA: Diagnosis not present

## 2020-05-30 DIAGNOSIS — R69 Illness, unspecified: Secondary | ICD-10-CM | POA: Diagnosis not present

## 2020-07-04 DIAGNOSIS — Z1231 Encounter for screening mammogram for malignant neoplasm of breast: Secondary | ICD-10-CM | POA: Diagnosis not present

## 2020-12-05 DIAGNOSIS — M810 Age-related osteoporosis without current pathological fracture: Secondary | ICD-10-CM | POA: Diagnosis not present

## 2020-12-05 DIAGNOSIS — I10 Essential (primary) hypertension: Secondary | ICD-10-CM | POA: Diagnosis not present

## 2021-03-18 DIAGNOSIS — L570 Actinic keratosis: Secondary | ICD-10-CM | POA: Diagnosis not present

## 2021-03-18 DIAGNOSIS — D2261 Melanocytic nevi of right upper limb, including shoulder: Secondary | ICD-10-CM | POA: Diagnosis not present

## 2021-03-18 DIAGNOSIS — L718 Other rosacea: Secondary | ICD-10-CM | POA: Diagnosis not present

## 2021-03-18 DIAGNOSIS — L821 Other seborrheic keratosis: Secondary | ICD-10-CM | POA: Diagnosis not present

## 2021-03-18 DIAGNOSIS — L820 Inflamed seborrheic keratosis: Secondary | ICD-10-CM | POA: Diagnosis not present

## 2021-03-18 DIAGNOSIS — D692 Other nonthrombocytopenic purpura: Secondary | ICD-10-CM | POA: Diagnosis not present

## 2021-03-18 DIAGNOSIS — D1801 Hemangioma of skin and subcutaneous tissue: Secondary | ICD-10-CM | POA: Diagnosis not present

## 2021-04-13 DIAGNOSIS — H26491 Other secondary cataract, right eye: Secondary | ICD-10-CM | POA: Diagnosis not present

## 2021-04-13 DIAGNOSIS — H0100A Unspecified blepharitis right eye, upper and lower eyelids: Secondary | ICD-10-CM | POA: Diagnosis not present

## 2021-04-13 DIAGNOSIS — Z961 Presence of intraocular lens: Secondary | ICD-10-CM | POA: Diagnosis not present

## 2021-04-23 DIAGNOSIS — H26491 Other secondary cataract, right eye: Secondary | ICD-10-CM | POA: Diagnosis not present

## 2021-04-23 DIAGNOSIS — H02831 Dermatochalasis of right upper eyelid: Secondary | ICD-10-CM | POA: Diagnosis not present

## 2021-04-23 DIAGNOSIS — H0102A Squamous blepharitis right eye, upper and lower eyelids: Secondary | ICD-10-CM | POA: Diagnosis not present

## 2021-04-23 DIAGNOSIS — Z961 Presence of intraocular lens: Secondary | ICD-10-CM | POA: Diagnosis not present

## 2021-05-28 DIAGNOSIS — Z0000 Encounter for general adult medical examination without abnormal findings: Secondary | ICD-10-CM | POA: Diagnosis not present

## 2021-05-28 DIAGNOSIS — E559 Vitamin D deficiency, unspecified: Secondary | ICD-10-CM | POA: Diagnosis not present

## 2021-05-29 DIAGNOSIS — Z0000 Encounter for general adult medical examination without abnormal findings: Secondary | ICD-10-CM | POA: Diagnosis not present

## 2021-05-29 DIAGNOSIS — N390 Urinary tract infection, site not specified: Secondary | ICD-10-CM | POA: Diagnosis not present

## 2021-06-05 DIAGNOSIS — M810 Age-related osteoporosis without current pathological fracture: Secondary | ICD-10-CM | POA: Diagnosis not present

## 2021-06-05 DIAGNOSIS — Z0000 Encounter for general adult medical examination without abnormal findings: Secondary | ICD-10-CM | POA: Diagnosis not present

## 2021-06-05 DIAGNOSIS — M8589 Other specified disorders of bone density and structure, multiple sites: Secondary | ICD-10-CM | POA: Diagnosis not present

## 2021-06-05 DIAGNOSIS — Z1212 Encounter for screening for malignant neoplasm of rectum: Secondary | ICD-10-CM | POA: Diagnosis not present

## 2021-06-05 DIAGNOSIS — I10 Essential (primary) hypertension: Secondary | ICD-10-CM | POA: Diagnosis not present

## 2021-06-05 DIAGNOSIS — E559 Vitamin D deficiency, unspecified: Secondary | ICD-10-CM | POA: Diagnosis not present

## 2021-07-02 DIAGNOSIS — L570 Actinic keratosis: Secondary | ICD-10-CM | POA: Diagnosis not present

## 2021-07-10 DIAGNOSIS — Z1231 Encounter for screening mammogram for malignant neoplasm of breast: Secondary | ICD-10-CM | POA: Diagnosis not present

## 2021-12-11 DIAGNOSIS — E559 Vitamin D deficiency, unspecified: Secondary | ICD-10-CM | POA: Diagnosis not present

## 2021-12-11 DIAGNOSIS — I10 Essential (primary) hypertension: Secondary | ICD-10-CM | POA: Diagnosis not present

## 2021-12-11 DIAGNOSIS — Z23 Encounter for immunization: Secondary | ICD-10-CM | POA: Diagnosis not present

## 2021-12-11 DIAGNOSIS — M810 Age-related osteoporosis without current pathological fracture: Secondary | ICD-10-CM | POA: Diagnosis not present

## 2022-03-24 DIAGNOSIS — L821 Other seborrheic keratosis: Secondary | ICD-10-CM | POA: Diagnosis not present

## 2022-03-24 DIAGNOSIS — L718 Other rosacea: Secondary | ICD-10-CM | POA: Diagnosis not present

## 2022-03-24 DIAGNOSIS — D1801 Hemangioma of skin and subcutaneous tissue: Secondary | ICD-10-CM | POA: Diagnosis not present

## 2022-03-24 DIAGNOSIS — L820 Inflamed seborrheic keratosis: Secondary | ICD-10-CM | POA: Diagnosis not present

## 2022-04-26 DIAGNOSIS — Z961 Presence of intraocular lens: Secondary | ICD-10-CM | POA: Diagnosis not present

## 2022-04-26 DIAGNOSIS — H26491 Other secondary cataract, right eye: Secondary | ICD-10-CM | POA: Diagnosis not present

## 2022-04-26 DIAGNOSIS — H0102A Squamous blepharitis right eye, upper and lower eyelids: Secondary | ICD-10-CM | POA: Diagnosis not present

## 2022-04-26 DIAGNOSIS — H02831 Dermatochalasis of right upper eyelid: Secondary | ICD-10-CM | POA: Diagnosis not present

## 2022-06-11 DIAGNOSIS — E7800 Pure hypercholesterolemia, unspecified: Secondary | ICD-10-CM | POA: Diagnosis not present

## 2022-06-11 DIAGNOSIS — E559 Vitamin D deficiency, unspecified: Secondary | ICD-10-CM | POA: Diagnosis not present

## 2022-06-11 DIAGNOSIS — N390 Urinary tract infection, site not specified: Secondary | ICD-10-CM | POA: Diagnosis not present

## 2022-06-11 DIAGNOSIS — Z0000 Encounter for general adult medical examination without abnormal findings: Secondary | ICD-10-CM | POA: Diagnosis not present

## 2022-06-18 ENCOUNTER — Other Ambulatory Visit: Payer: Self-pay | Admitting: Registered Nurse

## 2022-06-18 DIAGNOSIS — Z0000 Encounter for general adult medical examination without abnormal findings: Secondary | ICD-10-CM | POA: Diagnosis not present

## 2022-06-18 DIAGNOSIS — I10 Essential (primary) hypertension: Secondary | ICD-10-CM | POA: Diagnosis not present

## 2022-06-18 DIAGNOSIS — M810 Age-related osteoporosis without current pathological fracture: Secondary | ICD-10-CM | POA: Diagnosis not present

## 2022-06-18 DIAGNOSIS — Z8249 Family history of ischemic heart disease and other diseases of the circulatory system: Secondary | ICD-10-CM

## 2022-06-18 DIAGNOSIS — Z01419 Encounter for gynecological examination (general) (routine) without abnormal findings: Secondary | ICD-10-CM | POA: Diagnosis not present

## 2022-06-18 DIAGNOSIS — Z23 Encounter for immunization: Secondary | ICD-10-CM | POA: Diagnosis not present

## 2022-06-18 DIAGNOSIS — E559 Vitamin D deficiency, unspecified: Secondary | ICD-10-CM | POA: Diagnosis not present

## 2022-06-22 ENCOUNTER — Other Ambulatory Visit: Payer: Self-pay | Admitting: Registered Nurse

## 2022-06-22 DIAGNOSIS — Z8249 Family history of ischemic heart disease and other diseases of the circulatory system: Secondary | ICD-10-CM

## 2022-08-10 DIAGNOSIS — Z1231 Encounter for screening mammogram for malignant neoplasm of breast: Secondary | ICD-10-CM | POA: Diagnosis not present

## 2022-08-23 DIAGNOSIS — R928 Other abnormal and inconclusive findings on diagnostic imaging of breast: Secondary | ICD-10-CM | POA: Diagnosis not present

## 2022-08-23 DIAGNOSIS — R922 Inconclusive mammogram: Secondary | ICD-10-CM | POA: Diagnosis not present

## 2022-12-10 ENCOUNTER — Other Ambulatory Visit (HOSPITAL_COMMUNITY): Payer: Self-pay | Admitting: Registered Nurse

## 2022-12-10 DIAGNOSIS — M810 Age-related osteoporosis without current pathological fracture: Secondary | ICD-10-CM | POA: Diagnosis not present

## 2022-12-10 DIAGNOSIS — E559 Vitamin D deficiency, unspecified: Secondary | ICD-10-CM | POA: Diagnosis not present

## 2022-12-10 DIAGNOSIS — I10 Essential (primary) hypertension: Secondary | ICD-10-CM

## 2022-12-10 DIAGNOSIS — Z23 Encounter for immunization: Secondary | ICD-10-CM | POA: Diagnosis not present

## 2022-12-10 DIAGNOSIS — S51812A Laceration without foreign body of left forearm, initial encounter: Secondary | ICD-10-CM | POA: Diagnosis not present

## 2023-01-07 ENCOUNTER — Ambulatory Visit (HOSPITAL_COMMUNITY)
Admission: RE | Admit: 2023-01-07 | Discharge: 2023-01-07 | Disposition: A | Discharge status: Home / Self Care | Payer: Self-pay | Source: Ambulatory Visit | Attending: Registered Nurse | Admitting: Registered Nurse

## 2023-01-07 DIAGNOSIS — I10 Essential (primary) hypertension: Secondary | ICD-10-CM | POA: Insufficient documentation

## 2023-05-25 DIAGNOSIS — D1801 Hemangioma of skin and subcutaneous tissue: Secondary | ICD-10-CM | POA: Diagnosis not present

## 2023-05-25 DIAGNOSIS — L820 Inflamed seborrheic keratosis: Secondary | ICD-10-CM | POA: Diagnosis not present

## 2023-05-25 DIAGNOSIS — L821 Other seborrheic keratosis: Secondary | ICD-10-CM | POA: Diagnosis not present

## 2023-05-25 DIAGNOSIS — D692 Other nonthrombocytopenic purpura: Secondary | ICD-10-CM | POA: Diagnosis not present

## 2023-05-25 DIAGNOSIS — L853 Xerosis cutis: Secondary | ICD-10-CM | POA: Diagnosis not present

## 2023-05-25 DIAGNOSIS — L718 Other rosacea: Secondary | ICD-10-CM | POA: Diagnosis not present

## 2023-07-01 DIAGNOSIS — Z01419 Encounter for gynecological examination (general) (routine) without abnormal findings: Secondary | ICD-10-CM | POA: Diagnosis not present

## 2023-07-01 DIAGNOSIS — I10 Essential (primary) hypertension: Secondary | ICD-10-CM | POA: Diagnosis not present

## 2023-07-01 DIAGNOSIS — Z0000 Encounter for general adult medical examination without abnormal findings: Secondary | ICD-10-CM | POA: Diagnosis not present

## 2023-07-01 DIAGNOSIS — Z23 Encounter for immunization: Secondary | ICD-10-CM | POA: Diagnosis not present

## 2023-07-01 DIAGNOSIS — M810 Age-related osteoporosis without current pathological fracture: Secondary | ICD-10-CM | POA: Diagnosis not present

## 2023-08-12 DIAGNOSIS — Z1231 Encounter for screening mammogram for malignant neoplasm of breast: Secondary | ICD-10-CM | POA: Diagnosis not present

## 2023-12-30 DIAGNOSIS — E559 Vitamin D deficiency, unspecified: Secondary | ICD-10-CM | POA: Diagnosis not present

## 2023-12-30 DIAGNOSIS — I10 Essential (primary) hypertension: Secondary | ICD-10-CM | POA: Diagnosis not present

## 2023-12-30 DIAGNOSIS — M810 Age-related osteoporosis without current pathological fracture: Secondary | ICD-10-CM | POA: Diagnosis not present

## 2024-05-07 DIAGNOSIS — H26491 Other secondary cataract, right eye: Secondary | ICD-10-CM | POA: Diagnosis not present

## 2024-05-07 DIAGNOSIS — H52222 Regular astigmatism, left eye: Secondary | ICD-10-CM | POA: Diagnosis not present

## 2024-05-07 DIAGNOSIS — H524 Presbyopia: Secondary | ICD-10-CM | POA: Diagnosis not present

## 2024-05-07 DIAGNOSIS — H35373 Puckering of macula, bilateral: Secondary | ICD-10-CM | POA: Diagnosis not present

## 2024-05-07 DIAGNOSIS — H35443 Age-related reticular degeneration of retina, bilateral: Secondary | ICD-10-CM | POA: Diagnosis not present

## 2024-05-07 DIAGNOSIS — H02831 Dermatochalasis of right upper eyelid: Secondary | ICD-10-CM | POA: Diagnosis not present

## 2024-06-27 DIAGNOSIS — L820 Inflamed seborrheic keratosis: Secondary | ICD-10-CM | POA: Diagnosis not present

## 2024-06-27 DIAGNOSIS — L814 Other melanin hyperpigmentation: Secondary | ICD-10-CM | POA: Diagnosis not present

## 2024-06-27 DIAGNOSIS — D2261 Melanocytic nevi of right upper limb, including shoulder: Secondary | ICD-10-CM | POA: Diagnosis not present

## 2024-06-27 DIAGNOSIS — D692 Other nonthrombocytopenic purpura: Secondary | ICD-10-CM | POA: Diagnosis not present

## 2024-06-27 DIAGNOSIS — D225 Melanocytic nevi of trunk: Secondary | ICD-10-CM | POA: Diagnosis not present

## 2024-06-27 DIAGNOSIS — L718 Other rosacea: Secondary | ICD-10-CM | POA: Diagnosis not present

## 2024-06-27 DIAGNOSIS — D1801 Hemangioma of skin and subcutaneous tissue: Secondary | ICD-10-CM | POA: Diagnosis not present

## 2024-06-27 DIAGNOSIS — L821 Other seborrheic keratosis: Secondary | ICD-10-CM | POA: Diagnosis not present

## 2024-07-06 DIAGNOSIS — Z0000 Encounter for general adult medical examination without abnormal findings: Secondary | ICD-10-CM | POA: Diagnosis not present

## 2024-07-06 DIAGNOSIS — Z23 Encounter for immunization: Secondary | ICD-10-CM | POA: Diagnosis not present

## 2024-07-06 DIAGNOSIS — E559 Vitamin D deficiency, unspecified: Secondary | ICD-10-CM | POA: Diagnosis not present

## 2024-07-06 DIAGNOSIS — M810 Age-related osteoporosis without current pathological fracture: Secondary | ICD-10-CM | POA: Diagnosis not present

## 2024-07-06 DIAGNOSIS — I10 Essential (primary) hypertension: Secondary | ICD-10-CM | POA: Diagnosis not present

## 2024-08-31 DIAGNOSIS — Z1231 Encounter for screening mammogram for malignant neoplasm of breast: Secondary | ICD-10-CM | POA: Diagnosis not present

## 2024-09-04 ENCOUNTER — Encounter: Payer: Self-pay | Admitting: Emergency Medicine

## 2024-09-04 ENCOUNTER — Ambulatory Visit: Admitting: Radiology

## 2024-09-04 ENCOUNTER — Ambulatory Visit
Admission: EM | Admit: 2024-09-04 | Discharge: 2024-09-04 | Disposition: A | Discharge status: Home / Self Care | Source: Home / Self Care

## 2024-09-04 DIAGNOSIS — S80211A Abrasion, right knee, initial encounter: Secondary | ICD-10-CM

## 2024-09-04 DIAGNOSIS — W19XXXA Unspecified fall, initial encounter: Secondary | ICD-10-CM

## 2024-09-04 DIAGNOSIS — S82201A Unspecified fracture of shaft of right tibia, initial encounter for closed fracture: Secondary | ICD-10-CM | POA: Diagnosis not present

## 2024-09-04 DIAGNOSIS — M79601 Pain in right arm: Secondary | ICD-10-CM

## 2024-09-04 DIAGNOSIS — M85821 Other specified disorders of bone density and structure, right upper arm: Secondary | ICD-10-CM | POA: Diagnosis not present

## 2024-09-04 DIAGNOSIS — S4991XA Unspecified injury of right shoulder and upper arm, initial encounter: Secondary | ICD-10-CM | POA: Diagnosis not present

## 2024-09-04 DIAGNOSIS — M25561 Pain in right knee: Secondary | ICD-10-CM | POA: Diagnosis not present

## 2024-09-04 DIAGNOSIS — T07XXXA Unspecified multiple injuries, initial encounter: Secondary | ICD-10-CM

## 2024-09-04 MED ORDER — CEPHALEXIN 500 MG PO CAPS: 500.0000 mg | ORAL_CAPSULE | Freq: Three times a day (TID) | ORAL | 0 refills | Status: AC

## 2024-09-04 MED ORDER — ACETAMINOPHEN 325 MG PO TABS
650.0000 mg | ORAL_TABLET | Freq: Once | ORAL | Status: AC
  Administered 2024-09-04: 14:00:00 650 mg via ORAL

## 2024-09-04 NOTE — ED Provider Notes (Signed)
 [facility]    CSN: [id number] Arrival date & time: 09/04/24  1350      History   Chief Complaint Chief Complaint  Patient presents with   Fall    HPI Melissa Mayo is a 78 y.o. female.   78 year old female, Melissa Mayo, presents to urgent care for evaluation after fall earlier today.  Patient states she went for her daily walk around 1pm and tripped and fell on uneven path, patient complaining of right upper arm pain and decreased range of motion of right arm(points to humerus).  Patient also scraped up her right knee , pt is weight bearing, patient does not take blood thinners and did not hit her head, denies LOC. No meds PTA.   The history is provided by the patient. No language interpreter was used.  Fall    Past Medical History:  Diagnosis Date   Allergy    Anemia    Arthritis    Cataract    both eyes surgically removed   Environmental allergies    Hypertension    Osteoporosis    Postmenopausal     Patient Active Problem List   Diagnosis Date Noted   Fall 09/04/2024   Abrasions of multiple sites 09/04/2024   Right arm pain 09/04/2024   Abrasion of right knee 09/04/2024   Hyponatremia 06/29/2017   Hypertension 06/29/2017   Hypokalemia 06/29/2017   Prolonged QT interval 06/29/2017   Non-intractable vomiting with nausea     Past Surgical History:  Procedure Laterality Date   CATARACT EXTRACTION, BILATERAL     2003   COLONOSCOPY     cyst underneath left breast     1999 benign   gynecology procedure     2004   KNEE ARTHROSCOPY     1961   ovarian cystectomy, left     1970   TONSILLECTOMY     age 18   TUBAL LIGATION     1980    OB History   No obstetric history on file.      Home Medications    Prior to Admission medications  Medication Sig Start Date End Date Taking? Authorizing Provider  amLODipine (NORVASC) 2.5 MG tablet Take 2.5 mg by mouth daily. 08/11/24  Yes [provider]  cephALEXin  (KEFLEX ) 500 MG  capsule Take 1 capsule (500 mg total) by mouth 3 (three) times daily for 5 days. 09/04/24 09/09/24 Yes Dorathy Stallone, Rilla, NP  atenolol  (TENORMIN ) 100 MG tablet Take 1 tablet (100 mg total) by mouth daily. 07/02/17   Cherlyn Labella, MD  calcium  citrate-vitamin D  (CITRACAL+D) 315-200 MG-UNIT per tablet Take 2 tablets by mouth daily.    [provider]  cetirizine (ZYRTEC) 10 MG tablet Take 10 mg by mouth daily as needed for allergies.     [provider]  cyanocobalamin  1000 MCG tablet Take 1,000 mcg by mouth daily.    [provider]  metroNIDAZOLE (METROCREAM) 0.75 % cream Apply 1 application topically 2 (two) times daily.    [provider]  potassium chloride  (K-DUR) 10 MEQ tablet Take 10 mEq by mouth daily.    [provider]  Probiotic Product (PROBIOTIC DAILY PO) Take 1 capsule by mouth daily.    [provider]    Family History Family History  Problem Relation Age of Onset   Colon cancer Father    Colon polyps Neg Hx    Esophageal cancer Neg Hx    Rectal cancer Neg Hx    Stomach cancer Neg  Hx     Social History Social History[1]   Allergies   Patient has no known allergies.   Review of Systems Review of Systems  Musculoskeletal:  Positive for arthralgias and myalgias.  Skin:  Positive for wound.  All other systems reviewed and are negative.    Physical Exam Triage Vital Signs ED Triage Vitals  Encounter Vitals Group     BP 09/04/24 1400 (!) 161/78     Girls Systolic BP Percentile --      Girls Diastolic BP Percentile --      Boys Systolic BP Percentile --      Boys Diastolic BP Percentile --      Pulse Rate 09/04/24 1400 63     Resp 09/04/24 1400 17     Temp 09/04/24 1400 97.8 F (36.6 C)     Temp Source 09/04/24 1400 Oral     SpO2 09/04/24 1400 98 %     Weight --      Height --      Head Circumference --      Peak Flow --      Pain Score 09/04/24 1359 10     Pain Loc --      Pain Education --       Exclude from Growth Chart --    No data found.  Updated Vital Signs BP (!) 164/78 (BP Location: Left Arm)   Pulse 63   Temp 97.8 F (36.6 C) (Oral)   Resp 17   SpO2 98%   Visual Acuity Right Eye Distance:   Left Eye Distance:   Bilateral Distance:    Right Eye Near:   Left Eye Near:    Bilateral Near:     Physical Exam Vitals and nursing note reviewed.  Constitutional:      Appearance: She is well-developed and well-groomed.  HENT:     Head: Normocephalic.  Cardiovascular:     Rate and Rhythm: Normal rate.     Pulses:          Dorsalis pedis pulses are 2+ on the right side and 2+ on the left side.  Musculoskeletal:     Right upper arm: Tenderness and bony tenderness present. No swelling, edema, deformity or lacerations.     Comments: Decreased ROM of right arm,held in adduction, no bruising  Skin:    General: Skin is warm.     Capillary Refill: Capillary refill takes less than 2 seconds.     Findings: Abrasion, signs of injury and wound present.     Comments: Right anterior patella/ shin  Neurological:     General: No focal deficit present.     Mental Status: She is alert and oriented to person, place, and time.     GCS: GCS eye subscore is 4. GCS verbal subscore is 5. GCS motor subscore is 6.     Cranial Nerves: No cranial nerve deficit.     Sensory: Sensation is intact.     Comments: -ROM  Psychiatric:        Attention and Perception: Attention normal.        Mood and Affect: Mood normal.        Speech: Speech normal.        Behavior: Behavior is cooperative.      UC Treatments / Results  Labs (all labs ordered are listed, but only abnormal results are displayed) Labs Reviewed - No data to display  EKG   Radiology DG Humerus Right Result Date: 09/04/2024  CLINICAL DATA:  Fall and trauma to the right upper extremity. EXAM: RIGHT HUMERUS - 2+ VIEW COMPARISON:  None Available. FINDINGS: No acute fracture or dislocation. The bones are osteopenic. The  soft tissues are unremarkable. IMPRESSION: No acute fracture or dislocation. Electronically Signed   By: Vanetta Chou M.D.   On: 09/04/2024 14:39   DG Knee Complete 4 Views Right Result Date: 09/04/2024 CLINICAL DATA:  Right knee pain after fall EXAM: RIGHT KNEE - COMPLETE 4+ VIEW COMPARISON:  None Available. FINDINGS: No definite effusion or dislocation is noted. No significant joint space narrowing is noted. Rounded loose body seen posteriorly in knee joint. Possible cortical irregularity involving lateral tibial plateau. CT scan is recommended to rule out nondisplaced fracture. IMPRESSION: Possible cortical irregularity involving lateral tibial plateau. CT scan is recommended to rule out nondisplaced fracture. Electronically Signed   By: Lynwood Landy Raddle M.D.   On: 09/04/2024 14:38    Procedures Procedures (including critical care time)  Medications Ordered in UC Medications  acetaminophen  (TYLENOL ) tablet 650 mg (650 mg Oral Given 09/04/24 1422)    Initial Impression / Assessment and Plan / UC Course  I have reviewed the triage vital signs and the nursing notes.  Pertinent labs & imaging results that were available during my care of the patient were reviewed by me and considered in my medical decision making (see chart for details).    Discussed exam findings and plan of care with patient, pt is aware of Ct recommendations for ? right tibial plateua fracture, has FULL ROM of right lower leg, weight bearing, will follow up with PCP/Ortho. Sling applied to right arm for comfort,support, scripted keflex  for multiple abrasions to right LE, strict go to ER precautions given.   Patient verbalized understanding to this provider.  Ddx: Fall, right arm pain, abrasion of right knee Final Clinical Impressions(s) / UC Diagnoses   Final diagnoses:  Fall, initial encounter  Right arm pain  Abrasion of right knee, initial encounter     Discharge Instructions      Your xray of right  humerus is negative. Your xray of right lower leg ? Nondisplaced fracture recommend CT scan. Wear sling for comfort(arm), wear knee immobilizer for right knee, do not walk excessively on right leg Follow up with PCP/Orthopedics-call for appt, if you are unable to get an appointment with PCP or orthopedics or you have worsening right lower leg pain Go to the emergency room for further evaluation/CT May take tylenol  as label directed for pain, take Keflex  as directed for abrasions, daily wound care.      ED Prescriptions     Medication Sig Dispense Auth. Provider   cephALEXin  (KEFLEX ) 500 MG capsule Take 1 capsule (500 mg total) by mouth 3 (three) times daily for 5 days. 15 capsule Haris Baack, NP      PDMP not reviewed this encounter.     [1]  Social History Tobacco Use   Smoking status: Never   Smokeless tobacco: Never  Substance Use Topics   Alcohol use: No   Drug use: No     Quantia Grullon, Rilla, NP 09/04/24 1603

## 2024-09-04 NOTE — ED Triage Notes (Signed)
 Pt went on her daily walk around 1 today and fell. She c/o right upper arm pain and limited use. She also scraped up right knee.   She does not take blood thinner and didn't hit head.

## 2024-09-04 NOTE — Discharge Instructions (Addendum)
 Your xray of right humerus is negative. Your xray of right lower leg ? Nondisplaced fracture recommend CT scan. Wear sling for comfort(arm), wear knee immobilizer for right knee, do not walk excessively on right leg Follow up with PCP/Orthopedics-call for appt, if you are unable to get an appointment with PCP or orthopedics or you have worsening right lower leg pain Go to the emergency room for further evaluation/CT May take tylenol  as label directed for pain, take Keflex  as directed for abrasions, daily wound care.
